# Patient Record
Sex: Female | Born: 1965
Health system: Southern US, Community
[De-identification: ages and names within clinical notes are randomized; demographics above are authoritative.]

## PROBLEM LIST (undated history)

## (undated) DIAGNOSIS — E119 Type 2 diabetes mellitus without complications: Secondary | ICD-10-CM

## (undated) DIAGNOSIS — T7840XA Allergy, unspecified, initial encounter: Secondary | ICD-10-CM

## (undated) HISTORY — DX: Type 2 diabetes mellitus without complications: E11.9

## (undated) HISTORY — PX: ABLATION: SHX5711

## (undated) HISTORY — PX: DENTAL SURGERY: SHX609

## (undated) HISTORY — PX: CHOLECYSTECTOMY: SHX55

## (undated) HISTORY — DX: Allergy, unspecified, initial encounter: T78.40XA

---

## 1999-10-26 ENCOUNTER — Emergency Department (HOSPITAL_COMMUNITY): Admission: EM | Admit: 1999-10-26 | Discharge: 1999-10-27 | Payer: Self-pay | Admitting: Emergency Medicine

## 2014-05-26 ENCOUNTER — Ambulatory Visit (INDEPENDENT_AMBULATORY_CARE_PROVIDER_SITE_OTHER): Payer: BLUE CROSS/BLUE SHIELD | Admitting: Medical

## 2014-05-26 ENCOUNTER — Encounter: Payer: Self-pay | Admitting: Medical

## 2014-05-26 VITALS — BP 113/75 | HR 80 | Temp 97.6°F | Ht 65.0 in | Wt 146.0 lb

## 2014-05-26 DIAGNOSIS — J301 Allergic rhinitis due to pollen: Secondary | ICD-10-CM

## 2014-05-26 DIAGNOSIS — H60399 Other infective otitis externa, unspecified ear: Secondary | ICD-10-CM | POA: Insufficient documentation

## 2014-05-26 DIAGNOSIS — H6502 Acute serous otitis media, left ear: Secondary | ICD-10-CM | POA: Diagnosis not present

## 2014-05-26 DIAGNOSIS — J309 Allergic rhinitis, unspecified: Secondary | ICD-10-CM | POA: Insufficient documentation

## 2014-05-26 DIAGNOSIS — H60392 Other infective otitis externa, left ear: Secondary | ICD-10-CM

## 2014-05-26 DIAGNOSIS — H6690 Otitis media, unspecified, unspecified ear: Secondary | ICD-10-CM | POA: Insufficient documentation

## 2014-05-26 MED ORDER — FLUCONAZOLE 150 MG PO TABS: 150.0000 mg | ORAL_TABLET | Freq: Once | ORAL | Status: DC

## 2014-05-26 MED ORDER — AZITHROMYCIN 250 MG PO TABS: ORAL_TABLET | ORAL | Status: DC

## 2014-05-26 MED ORDER — NEOMYCIN-POLYMYXIN-HC 1 % OT SOLN: 3.0000 [drp] | Freq: Four times a day (QID) | OTIC | Status: DC

## 2014-05-26 NOTE — Assessment & Plan Note (Signed)
Relatively controlled now. Spring and fall are worst seasons.

## 2014-05-26 NOTE — Assessment & Plan Note (Signed)
By appearance and will give cortisporin ear drops.

## 2014-05-26 NOTE — Progress Notes (Signed)
Subjective:    Patient ID: Suzanne Moore, female    DOB: February 12, 1966, 49 y.o.   MRN: [id number]  HPI   I have reviewed pt PMH, PSH, FH, Social History and Surgical History.  Allergies- Seasonal. Worst season spring and fall. Sometimes sinus infections. Presently controlled. [facility].  Gestational diabetes- only. That was last checked a while ago.   Hx irregular menstruation/heavy- Since ablation has been controlled.  Pt has acute complaint of some left ear that may have some bleeding. Sunday she felt  ear wet and she got a q tip. She got some blood  on q tip when she used. [facility] and they saw some blood in the canal but could not determine source. Pt has some pain last night from ear toward cheek.  Pt gets history of occasional ear infections. Also occasional swimmers ear.  Faint occasional allergies itchy eyes and scratchy throat but rains will help and prevent full flare.  Pt esthetician(skin), Exercise 5 days a week, 1 sweet tea a day, Married- 2 children.      Review of Systems  Constitutional: Negative for fever, chills and fatigue.  HENT: Positive for ear pain and sore throat. Negative for congestion, dental problem, postnasal drip, rhinorrhea, sneezing and tinnitus.        Mild scratchy throat.  Eyes: Positive for itching.  Respiratory: Negative for cough, chest tightness, shortness of breath and wheezing.   Cardiovascular: Negative for chest pain and palpitations.  Skin: Negative for rash.  Neurological: Negative for dizziness, syncope, weakness and light-headedness.  Hematological: Negative for adenopathy. Does not bruise/bleed easily.   Past Medical History  Diagnosis Date  . Allergy   . Diabetes mellitus without complication     Gestational Diabetes    History   Social History  . Marital Status: Married    Spouse Name: N/A  . Number of Children: N/A  . Years of Education: N/A   Occupational History  . Not on  file.   Social History Main Topics  . Smoking status: Former Games developermoker  . Smokeless tobacco: Never Used  . Alcohol Use: No  . Drug Use: Not on file  . Sexual Activity: Not on file   Other Topics Concern  . Not on file   Social History Narrative  . No narrative on file    Past Surgical History  Procedure Laterality Date  . Cholecystectomy    . Cesarean section    . Ablation    . Dental surgery      Teeth removed for Dentures    Family History  Problem Relation Age of Onset  . Hyperlipidemia Mother   . Diabetes Mother   . Heart disease Mother   . Bipolar disorder Mother   . Stroke Father   . Glaucoma Father     Allergies  Allergen Reactions  . Penicillins     Yeast infections(side effect)    No current outpatient prescriptions on file prior to visit.   No current facility-administered medications on file prior to visit.    BP 113/75 mmHg  Pulse 80  Temp(Src) 97.6 F (36.4 C) (Oral)  Ht 5\' 5"  (1.651 m)  Wt 146 lb (66.225 kg)  BMI 24.30 kg/m2  SpO2 98%      Objective:   Physical Exam  General  Mental Status - Alert. General Appearance - Well groomed. Not in acute distress.  Skin Rashes- No Rashes.  HEENT Head- Normal. Ear Auditory  Canal - Left-bottom of canal mild red and inflammed. Back side of canal wax vs scab  Right - Normal.Tympanic Membrane- Left- faint red on edge of tmRight- Normal. Eye Sclera/Conjunctiva- Left- Normal. Right- Normal. Nose & Sinuses Nasal Mucosa- Left-  Boggy and Congested. Right-  Boggy and  Congested.No  maxillary and no frontal sinus pressure. Mouth & Throat Lips: Upper Lip- Normal: no dryness, cracking, pallor, cyanosis, or vesicular eruption. Lower Lip-Normal: no dryness, cracking, pallor, cyanosis or vesicular eruption. Buccal Mucosa- Bilateral- No Aphthous ulcers. Oropharynx- No Discharge or Erythema. Tonsils: Characteristics- Bilateral- No Erythema or Congestion. Size/Enlargement- Bilateral- No enlargement.  Discharge- bilateral-None.  Neck Neck- Supple. No Masses.   Chest and Lung Exam Auscultation: Breath Sounds:-Clear even and unlabored.  Cardiovascular Auscultation:Rythm- Regular, rate and rhythm. Murmurs & Other Heart Sounds:Ausculatation of the heart reveal- No Murmurs.  Lymphatic Head & Neck General Head & Neck Lymphatics: Bilateral: Description- No Localized lymphadenopathy.       Assessment & Plan:

## 2014-05-26 NOTE — Patient Instructions (Signed)
Otitis, externa, infective By appearance and will give cortisporin ear drops.   Otitis media Edge of tm red and with recent moderate pain will give antibiotic.     Also would recommend flonase and claritin for potential allergies.   Follow up in 3-4 wks or as needed(prior to ent referral)  Also could schedule early morning wellness exam over next 2-3 months.

## 2014-05-26 NOTE — Assessment & Plan Note (Signed)
Edge of tm red and with recent moderate pain will give antibiotic.

## 2014-05-26 NOTE — Progress Notes (Signed)
Pre visit review using our clinic review tool, if applicable. No additional management support is needed unless otherwise documented below in the visit note. 

## 2014-06-08 ENCOUNTER — Telehealth: Payer: Self-pay | Admitting: *Deleted

## 2014-06-08 NOTE — Telephone Encounter (Signed)
Unable to reach patient at time of Pre-Visit Call.  Left message for patient to return call when available.    

## 2014-06-09 ENCOUNTER — Telehealth: Payer: Self-pay | Admitting: Medical

## 2014-06-09 ENCOUNTER — Ambulatory Visit (HOSPITAL_BASED_OUTPATIENT_CLINIC_OR_DEPARTMENT_OTHER)
Admission: RE | Admit: 2014-06-09 | Discharge: 2014-06-09 | Disposition: A | Discharge status: Home / Self Care | Payer: BLUE CROSS/BLUE SHIELD | Source: Ambulatory Visit | Attending: Medical | Admitting: Medical

## 2014-06-09 ENCOUNTER — Ambulatory Visit (INDEPENDENT_AMBULATORY_CARE_PROVIDER_SITE_OTHER): Payer: BLUE CROSS/BLUE SHIELD | Admitting: Medical

## 2014-06-09 ENCOUNTER — Other Ambulatory Visit (HOSPITAL_COMMUNITY)
Admission: RE | Admit: 2014-06-09 | Discharge: 2014-06-09 | Disposition: A | Discharge status: Home / Self Care | Payer: BLUE CROSS/BLUE SHIELD | Source: Ambulatory Visit | Attending: Family Medicine | Admitting: Family Medicine

## 2014-06-09 ENCOUNTER — Encounter: Payer: Self-pay | Admitting: Medical

## 2014-06-09 VITALS — BP 111/76 | HR 74 | Temp 98.0°F | Ht 65.0 in | Wt 142.4 lb

## 2014-06-09 DIAGNOSIS — Z1239 Encounter for other screening for malignant neoplasm of breast: Secondary | ICD-10-CM

## 2014-06-09 DIAGNOSIS — Z23 Encounter for immunization: Secondary | ICD-10-CM | POA: Diagnosis not present

## 2014-06-09 DIAGNOSIS — Z1389 Encounter for screening for other disorder: Secondary | ICD-10-CM

## 2014-06-09 DIAGNOSIS — Z124 Encounter for screening for malignant neoplasm of cervix: Secondary | ICD-10-CM

## 2014-06-09 DIAGNOSIS — R82998 Other abnormal findings in urine: Secondary | ICD-10-CM

## 2014-06-09 DIAGNOSIS — Z01419 Encounter for gynecological examination (general) (routine) without abnormal findings: Secondary | ICD-10-CM | POA: Diagnosis present

## 2014-06-09 DIAGNOSIS — Z0000 Encounter for general adult medical examination without abnormal findings: Secondary | ICD-10-CM | POA: Diagnosis not present

## 2014-06-09 DIAGNOSIS — N390 Urinary tract infection, site not specified: Secondary | ICD-10-CM | POA: Diagnosis not present

## 2014-06-09 DIAGNOSIS — Z87898 Personal history of other specified conditions: Secondary | ICD-10-CM

## 2014-06-09 DIAGNOSIS — N63 Unspecified lump in unspecified breast: Secondary | ICD-10-CM

## 2014-06-09 LAB — COMPREHENSIVE METABOLIC PANEL
ALT: 20 U/L (ref 0–35)
AST: 19 U/L (ref 0–37)
Albumin: 4.3 g/dL (ref 3.5–5.2)
Alkaline Phosphatase: 75 U/L (ref 39–117)
BILIRUBIN TOTAL: 0.4 mg/dL (ref 0.2–1.2)
BUN: 17 mg/dL (ref 6–23)
CALCIUM: 9.2 mg/dL (ref 8.4–10.5)
CO2: 31 mEq/L (ref 19–32)
Chloride: 106 mEq/L (ref 96–112)
Creatinine, Ser: 0.91 mg/dL (ref 0.40–1.20)
GFR: 69.86 mL/min (ref >60.00)
Glucose, Bld: 94 mg/dL (ref 70–99)
POTASSIUM: 3.7 meq/L (ref 3.5–5.1)
Sodium: 142 mEq/L (ref 135–145)
TOTAL PROTEIN: 6.8 g/dL (ref 6.0–8.3)

## 2014-06-09 LAB — LIPID PANEL
CHOL/HDL RATIO: 3
Cholesterol: 129 mg/dL (ref 0–200)
HDL: 39.5 mg/dL (ref >39.00)
LDL Cholesterol: 77 mg/dL (ref 0–99)
NonHDL: 89.5
TRIGLYCERIDES: 64 mg/dL (ref 0.0–149.0)
VLDL: 12.8 mg/dL (ref 0.0–40.0)

## 2014-06-09 LAB — CBC WITH DIFFERENTIAL/PLATELET
Basophils Absolute: 0 10*3/uL (ref 0.0–0.1)
Basophils Relative: 0.5 % (ref 0.0–3.0)
EOS PCT: 4.7 % (ref 0.0–5.0)
Eosinophils Absolute: 0.2 10*3/uL (ref 0.0–0.7)
HEMATOCRIT: 42.4 % (ref 36.0–46.0)
Hemoglobin: 14.6 g/dL (ref 12.0–15.0)
LYMPHS ABS: 1.5 10*3/uL (ref 0.7–4.0)
Lymphocytes Relative: 29.5 % (ref 12.0–46.0)
MCHC: 34.5 g/dL (ref 30.0–36.0)
MCV: 87.8 fl (ref 78.0–100.0)
MONO ABS: 0.4 10*3/uL (ref 0.1–1.0)
MONOS PCT: 8 % (ref 3.0–12.0)
Neutro Abs: 2.9 10*3/uL (ref 1.4–7.7)
Neutrophils Relative %: 57.3 % (ref 43.0–77.0)
PLATELETS: 146 10*3/uL — AB (ref 150.0–400.0)
RBC: 4.83 Mil/uL (ref 3.87–5.11)
RDW: 11.7 % (ref 11.5–15.5)
WBC: 5 10*3/uL (ref 4.0–10.5)

## 2014-06-09 LAB — POCT URINALYSIS DIPSTICK
Bilirubin, UA: 1
Glucose, UA: NEGATIVE
Ketones, UA: NEGATIVE
Nitrite, UA: NEGATIVE
PH UA: 6
Protein, UA: 15
Spec Grav, UA: >=1.03
UROBILINOGEN UA: 0.2

## 2014-06-09 LAB — TSH: TSH: 1.84 u[IU]/mL (ref 0.35–4.50)

## 2014-06-09 NOTE — Telephone Encounter (Signed)
You will need to place the order

## 2014-06-09 NOTE — Telephone Encounter (Signed)
Per radiology, pt [facility]. I do not see any reports

## 2014-06-09 NOTE — Patient Instructions (Addendum)
Wellness examination Cbc,cmp, tsh, lipid panel, ua, tdap today.  Pap done and order for mammogram placed.      Preventive Care for Adults A healthy lifestyle and preventive care can promote health and wellness. Preventive health guidelines for women include the following key practices.  A routine yearly physical is a good way to check with your health care provider about your health and preventive screening. It is a chance to share any concerns and updates on your health and to receive a thorough exam.  Visit your dentist for a routine exam and preventive care every 6 months. Brush your teeth twice a day and floss once a day. Good oral hygiene prevents tooth decay and gum disease.  The frequency of eye exams is based on your age, health, family medical history, use of contact lenses, and other factors. Follow your health care provider's recommendations for frequency of eye exams.  Eat a healthy diet. Foods like vegetables, fruits, whole grains, low-fat dairy products, and lean protein foods contain the nutrients you need without too many calories. Decrease your intake of foods high in solid fats, added sugars, and salt. Eat the right amount of calories for you.Get information about a proper diet from your health care provider, if necessary.  Regular physical exercise is one of the most important things you can do for your health. Most adults should get at least 150 minutes of moderate-intensity exercise (any activity that increases your heart rate and causes you to sweat) each week. In addition, most adults need muscle-strengthening exercises on 2 or more days a week.  Maintain a healthy weight. The body mass index (BMI) is a screening tool to identify possible weight problems. It provides an estimate of body fat based on height and weight. Your health care provider can find your BMI and can help you achieve or maintain a healthy weight.For adults 20 years and older:  A BMI below 18.5 is  considered underweight.  A BMI of 18.5 to 24.9 is normal.  A BMI of 25 to 29.9 is considered overweight.  A BMI of 30 and above is considered obese.  Maintain normal blood lipids and cholesterol levels by exercising and minimizing your intake of saturated fat. Eat a balanced diet with plenty of fruit and vegetables. Blood tests for lipids and cholesterol should begin at age 70 and be repeated every 5 years. If your lipid or cholesterol levels are high, you are over 50, or you are at high risk for heart disease, you may need your cholesterol levels checked more frequently.Ongoing high lipid and cholesterol levels should be treated with medicines if diet and exercise are not working.  If you smoke, find out from your health care provider how to quit. If you do not use tobacco, do not start.  Lung cancer screening is recommended for adults aged 43-80 years who are at high risk for developing lung cancer because of a history of smoking. A yearly low-dose CT scan of the lungs is recommended for people who have at least a 30-pack-year history of smoking and are a current smoker or have quit within the past 15 years. A pack year of smoking is smoking an average of 1 pack of cigarettes a day for 1 year (for example: 1 pack a day for 30 years or 2 packs a day for 15 years). Yearly screening should continue until the smoker has stopped smoking for at least 15 years. Yearly screening should be stopped for people who develop a health problem  that would prevent them from having lung cancer treatment.  If you are pregnant, do not drink alcohol. If you are breastfeeding, be very cautious about drinking alcohol. If you are not pregnant and choose to drink alcohol, do not have more than 1 drink per day. One drink is considered to be 12 ounces (355 mL) of beer, 5 ounces (148 mL) of wine, or 1.5 ounces (44 mL) of liquor.  Avoid use of street drugs. Do not share needles with anyone. Ask for help if you need support or  instructions about stopping the use of drugs.  High blood pressure causes heart disease and increases the risk of stroke. Your blood pressure should be checked at least every 1 to 2 years. Ongoing high blood pressure should be treated with medicines if weight loss and exercise do not work.  If you are 55-79 years old, ask your health care provider if you should take aspirin to prevent strokes.  Diabetes screening involves taking a blood sample to check your fasting blood sugar level. This should be done once every 3 years, after age 45, if you are within normal weight and without risk factors for diabetes. Testing should be considered at a younger age or be carried out more frequently if you are overweight and have at least 1 risk factor for diabetes.  Breast cancer screening is essential preventive care for women. You should practice "breast self-awareness." This means understanding the normal appearance and feel of your breasts and may include breast self-examination. Any changes detected, no matter how small, should be reported to a health care provider. Women in their 20s and 30s should have a clinical breast exam (CBE) by a health care provider as part of a regular health exam every 1 to 3 years. After age 40, women should have a CBE every year. Starting at age 40, women should consider having a mammogram (breast X-ray test) every year. Women who have a family history of breast cancer should talk to their health care provider about genetic screening. Women at a high risk of breast cancer should talk to their health care providers about having an MRI and a mammogram every year.  Breast cancer gene (BRCA)-related cancer risk assessment is recommended for women who have family members with BRCA-related cancers. BRCA-related cancers include breast, ovarian, tubal, and peritoneal cancers. Having family members with these cancers may be associated with an increased risk for harmful changes (mutations) in  the breast cancer genes BRCA1 and BRCA2. Results of the assessment will determine the need for genetic counseling and BRCA1 and BRCA2 testing.  Routine pelvic exams to screen for cancer are no longer recommended for nonpregnant women who are considered low risk for cancer of the pelvic organs (ovaries, uterus, and vagina) and who do not have symptoms. Ask your health care provider if a screening pelvic exam is right for you.  If you have had past treatment for cervical cancer or a condition that could lead to cancer, you need Pap tests and screening for cancer for at least 20 years after your treatment. If Pap tests have been discontinued, your risk factors (such as having a new sexual partner) need to be reassessed to determine if screening should be resumed. Some women have medical problems that increase the chance of getting cervical cancer. In these cases, your health care provider may recommend more frequent screening and Pap tests.  The HPV test is an additional test that may be used for cervical cancer screening. The HPV test   looks for the virus that can cause the cell changes on the cervix. The cells collected during the Pap test can be tested for HPV. The HPV test could be used to screen women aged 23 years and older, and should be used in women of any age who have unclear Pap test results. After the age of 28, women should have HPV testing at the same frequency as a Pap test.  Colorectal cancer can be detected and often prevented. Most routine colorectal cancer screening begins at the age of 93 years and continues through age 46 years. However, your health care provider may recommend screening at an earlier age if you have risk factors for colon cancer. On a yearly basis, your health care provider may provide home test kits to check for hidden blood in the stool. Use of a small camera at the end of a tube, to directly examine the colon (sigmoidoscopy or colonoscopy), can detect the earliest forms  of colorectal cancer. Talk to your health care provider about this at age 29, when routine screening begins. Direct exam of the colon should be repeated every 5-10 years through age 17 years, unless early forms of pre-cancerous polyps or small growths are found.  People who are at an increased risk for hepatitis B should be screened for this virus. You are considered at high risk for hepatitis B if:  You were born in a country where hepatitis B occurs often. Talk with your health care provider about which countries are considered high risk.  Your parents were born in a high-risk country and you have not received a shot to protect against hepatitis B (hepatitis B vaccine).  You have HIV or AIDS.  You use needles to inject street drugs.  You live with, or have sex with, someone who has hepatitis B.  You get hemodialysis treatment.  You take certain medicines for conditions like cancer, organ transplantation, and autoimmune conditions.  Hepatitis C blood testing is recommended for all people born from [birth year] through 1965 and any individual with known risks for hepatitis C.  Practice safe sex. Use condoms and avoid high-risk sexual practices to reduce the spread of sexually transmitted infections (STIs). STIs include gonorrhea, chlamydia, syphilis, trichomonas, herpes, HPV, and human immunodeficiency virus (HIV). Herpes, HIV, and HPV are viral illnesses that have no cure. They can result in disability, cancer, and death.  You should be screened for sexually transmitted illnesses (STIs) including gonorrhea and chlamydia if:  You are sexually active and are younger than 24 years.  You are older than 24 years and your health care provider tells you that you are at risk for this type of infection.  Your sexual activity has changed since you were last screened and you are at an increased risk for chlamydia or gonorrhea. Ask your health care provider if you are at risk.  If you are at risk of  being infected with HIV, it is recommended that you take a prescription medicine daily to prevent HIV infection. This is called preexposure prophylaxis (PrEP). You are considered at risk if:  You are a heterosexual woman, are sexually active, and are at increased risk for HIV infection.  You take drugs by injection.  You are sexually active with a partner who has HIV.  Talk with your health care provider about whether you are at high risk of being infected with HIV. If you choose to begin PrEP, you should first be tested for HIV. You should then be tested every 3  months for as long as you are taking PrEP.  Osteoporosis is a disease in which the bones lose minerals and strength with aging. This can result in serious bone fractures or breaks. The risk of osteoporosis can be identified using a bone density scan. Women ages 78 years and over and women at risk for fractures or osteoporosis should discuss screening with their health care providers. Ask your health care provider whether you should take a calcium supplement or vitamin D to reduce the rate of osteoporosis.  Menopause can be associated with physical symptoms and risks. Hormone replacement therapy is available to decrease symptoms and risks. You should talk to your health care provider about whether hormone replacement therapy is right for you.  Use sunscreen. Apply sunscreen liberally and repeatedly throughout the day. You should seek shade when your shadow is shorter than you. Protect yourself by wearing long sleeves, pants, a wide-brimmed hat, and sunglasses year round, whenever you are outdoors.  Once a month, do a whole body skin exam, using a mirror to look at the skin on your back. Tell your health care provider of new moles, moles that have irregular borders, moles that are larger than a pencil eraser, or moles that have changed in shape or color.  Stay current with required vaccines (immunizations).  Influenza vaccine. All adults  should be immunized every year.  Tetanus, diphtheria, and acellular pertussis (Td, Tdap) vaccine. Pregnant women should receive 1 dose of Tdap vaccine during each pregnancy. The dose should be obtained regardless of the length of time since the last dose. Immunization is preferred during the 27th-36th week of gestation. An adult who has not previously received Tdap or who does not know her vaccine status should receive 1 dose of Tdap. This initial dose should be followed by tetanus and diphtheria toxoids (Td) booster doses every 10 years. Adults with an unknown or incomplete history of completing a 3-dose immunization series with Td-containing vaccines should begin or complete a primary immunization series including a Tdap dose. Adults should receive a Td booster every 10 years.  Varicella vaccine. An adult without evidence of immunity to varicella should receive 2 doses or a second dose if she has previously received 1 dose. Pregnant females who do not have evidence of immunity should receive the first dose after pregnancy. This first dose should be obtained before leaving the health care facility. The second dose should be obtained 4-8 weeks after the first dose.  Human papillomavirus (HPV) vaccine. Females aged 13-26 years who have not received the vaccine previously should obtain the 3-dose series. The vaccine is not recommended for use in pregnant females. However, pregnancy testing is not needed before receiving a dose. If a female is found to be pregnant after receiving a dose, no treatment is needed. In that case, the remaining doses should be delayed until after the pregnancy. Immunization is recommended for any person with an immunocompromised condition through the age of 65 years if she did not get any or all doses earlier. During the 3-dose series, the second dose should be obtained 4-8 weeks after the first dose. The third dose should be obtained 24 weeks after the first dose and 16 weeks after  the second dose.  Zoster vaccine. One dose is recommended for adults aged 11 years or older unless certain conditions are present.  Measles, mumps, and rubella (MMR) vaccine. Adults born before [birth year] generally are considered immune to measles and mumps. Adults born in [birth year] or later should have 1  or more doses of MMR vaccine unless there is a contraindication to the vaccine or there is laboratory evidence of immunity to each of the three diseases. A routine second dose of MMR vaccine should be obtained at least 28 days after the first dose for students attending postsecondary schools, health care workers, or international travelers. People who received inactivated measles vaccine or an unknown type of measles vaccine during 1963-1967 should receive 2 doses of MMR vaccine. People who received inactivated mumps vaccine or an unknown type of mumps vaccine before 1979 and are at high risk for mumps infection should consider immunization with 2 doses of MMR vaccine. For females of childbearing age, rubella immunity should be determined. If there is no evidence of immunity, females who are not pregnant should be vaccinated. If there is no evidence of immunity, females who are pregnant should delay immunization until after pregnancy. Unvaccinated health care workers born before 1957 who lack laboratory evidence of measles, mumps, or rubella immunity or laboratory confirmation of disease should consider measles and mumps immunization with 2 doses of MMR vaccine or rubella immunization with 1 dose of MMR vaccine.  Pneumococcal 13-valent conjugate (PCV13) vaccine. When indicated, a person who is uncertain of her immunization history and has no record of immunization should receive the PCV13 vaccine. An adult aged 19 years or older who has certain medical conditions and has not been previously immunized should receive 1 dose of PCV13 vaccine. This PCV13 should be followed with a dose of pneumococcal polysaccharide (PPSV23)  vaccine. The PPSV23 vaccine dose should be obtained at least 8 weeks after the dose of PCV13 vaccine. An adult aged 19 years or older who has certain medical conditions and previously received 1 or more doses of PPSV23 vaccine should receive 1 dose of PCV13. The PCV13 vaccine dose should be obtained 1 or more years after the last PPSV23 vaccine dose.  Pneumococcal polysaccharide (PPSV23) vaccine. When PCV13 is also indicated, PCV13 should be obtained first. All adults aged 65 years and older should be immunized. An adult younger than age 65 years who has certain medical conditions should be immunized. Any person who resides in a nursing home or long-term care facility should be immunized. An adult smoker should be immunized. People with an immunocompromised condition and certain other conditions should receive both PCV13 and PPSV23 vaccines. People with human immunodeficiency virus (HIV) infection should be immunized as soon as possible after diagnosis. Immunization during chemotherapy or radiation therapy should be avoided. Routine use of PPSV23 vaccine is not recommended for American Indians, [place] Natives, or people younger than 65 years unless there are medical conditions that require PPSV23 vaccine. When indicated, people who have unknown immunization and have no record of immunization should receive PPSV23 vaccine. One-time revaccination 5 years after the first dose of PPSV23 is recommended for people aged 19-64 years who have chronic kidney failure, nephrotic syndrome, asplenia, or immunocompromised conditions. People who received 1-2 doses of PPSV23 before age 65 years should receive another dose of PPSV23 vaccine at age 65 years or later if at least 5 years have passed since the previous dose. Doses of PPSV23 are not needed for people immunized with PPSV23 at or after age 65 years.  Meningococcal vaccine. Adults with asplenia or persistent complement component deficiencies should receive 2 doses of  quadrivalent meningococcal conjugate (MenACWY-D) vaccine. The doses should be obtained at least 2 months apart. Microbiologists working with certain meningococcal bacteria, military recruits, people at risk during an outbreak, and people who   travel to or live in countries with a high rate of meningitis should be immunized. A first-year college student up through age 63 years who is living in a residence hall should receive a dose if she did not receive a dose on or after her 16th birthday. Adults who have certain high-risk conditions should receive one or more doses of vaccine.  Hepatitis A vaccine. Adults who wish to be protected from this disease, have certain high-risk conditions, work with hepatitis A-infected animals, work in hepatitis A research labs, or travel to or work in countries with a high rate of hepatitis A should be immunized. Adults who were previously unvaccinated and who anticipate close contact with an international adoptee during the first 60 days after arrival in the [place] from a country with a high rate of hepatitis A should be immunized.  Hepatitis B vaccine. Adults who wish to be protected from this disease, have certain high-risk conditions, may be exposed to blood or other infectious body fluids, are household contacts or sex partners of hepatitis B positive people, are clients or workers in certain care facilities, or travel to or work in countries with a high rate of hepatitis B should be immunized.  Haemophilus influenzae type b (Hib) vaccine. A previously unvaccinated person with asplenia or sickle cell disease or having a scheduled splenectomy should receive 1 dose of Hib vaccine. Regardless of previous immunization, a recipient of a hematopoietic stem cell transplant should receive a 3-dose series 6-12 months after her successful transplant. Hib vaccine is not recommended for adults with HIV infection. Preventive Services / Frequency Ages 31 to 10 years  Blood  pressure check.** / Every 1 to 2 years.  Lipid and cholesterol check.** / Every 5 years beginning at age 47.  Clinical breast exam.** / Every 3 years for women in their 21s and 88s.  BRCA-related cancer risk assessment.** / For women who have family members with a BRCA-related cancer (breast, ovarian, tubal, or peritoneal cancers).  Pap test.** / Every 2 years from ages 76 through 11. Every 3 years starting at age 45 through age 52 or 66 with a history of 3 consecutive normal Pap tests.  HPV screening.** / Every 3 years from ages 31 through ages 23 to 85 with a history of 3 consecutive normal Pap tests.  Hepatitis C blood test.** / For any individual with known risks for hepatitis C.  Skin self-exam. / Monthly.  Influenza vaccine. / Every year.  Tetanus, diphtheria, and acellular pertussis (Tdap, Td) vaccine.** / Consult your health care provider. Pregnant women should receive 1 dose of Tdap vaccine during each pregnancy. 1 dose of Td every 10 years.  Varicella vaccine.** / Consult your health care provider. Pregnant females who do not have evidence of immunity should receive the first dose after pregnancy.  HPV vaccine. / 3 doses over 6 months, if 6 and younger. The vaccine is not recommended for use in pregnant females. However, pregnancy testing is not needed before receiving a dose.  Measles, mumps, rubella (MMR) vaccine.** / You need at least 1 dose of MMR if you were born in 1957 or later. You may also need a 2nd dose. For females of childbearing age, rubella immunity should be determined. If there is no evidence of immunity, females who are not pregnant should be vaccinated. If there is no evidence of immunity, females who are pregnant should delay immunization until after pregnancy.  Pneumococcal 13-valent conjugate (PCV13) vaccine.** / Consult your health care provider.  Pneumococcal polysaccharide (PPSV23) vaccine.** / 1 to 2 doses if you smoke cigarettes or if you have certain  conditions.  Meningococcal vaccine.** / 1 dose if you are age 19 to 21 years and a first-year college student living in a residence hall, or have one of several medical conditions, you need to get vaccinated against meningococcal disease. You may also need additional booster doses.  Hepatitis A vaccine.** / Consult your health care provider.  Hepatitis B vaccine.** / Consult your health care provider.  Haemophilus influenzae type b (Hib) vaccine.** / Consult your health care provider. Ages 40 to 64 years  Blood pressure check.** / Every 1 to 2 years.  Lipid and cholesterol check.** / Every 5 years beginning at age 20 years.  Lung cancer screening. / Every year if you are aged 55-80 years and have a 30-pack-year history of smoking and currently smoke or have quit within the past 15 years. Yearly screening is stopped once you have quit smoking for at least 15 years or develop a health problem that would prevent you from having lung cancer treatment.  Clinical breast exam.** / Every year after age 40 years.  BRCA-related cancer risk assessment.** / For women who have family members with a BRCA-related cancer (breast, ovarian, tubal, or peritoneal cancers).  Mammogram.** / Every year beginning at age 40 years and continuing for as long as you are in good health. Consult with your health care provider.  Pap test.** / Every 3 years starting at age 30 years through age 65 or 70 years with a history of 3 consecutive normal Pap tests.  HPV screening.** / Every 3 years from ages 30 years through ages 65 to 70 years with a history of 3 consecutive normal Pap tests.  Fecal occult blood test (FOBT) of stool. / Every year beginning at age 50 years and continuing until age 75 years. You may not need to do this test if you get a colonoscopy every 10 years.  Flexible sigmoidoscopy or colonoscopy.** / Every 5 years for a flexible sigmoidoscopy or every 10 years for a colonoscopy beginning at age 50 years  and continuing until age 75 years.  Hepatitis C blood test.** / For all people born from 1945 through 1965 and any individual with known risks for hepatitis C.  Skin self-exam. / Monthly.  Influenza vaccine. / Every year.  Tetanus, diphtheria, and acellular pertussis (Tdap/Td) vaccine.** / Consult your health care provider. Pregnant women should receive 1 dose of Tdap vaccine during each pregnancy. 1 dose of Td every 10 years.  Varicella vaccine.** / Consult your health care provider. Pregnant females who do not have evidence of immunity should receive the first dose after pregnancy.  Zoster vaccine.** / 1 dose for adults aged 60 years or older.  Measles, mumps, rubella (MMR) vaccine.** / You need at least 1 dose of MMR if you were born in 1957 or later. You may also need a 2nd dose. For females of childbearing age, rubella immunity should be determined. If there is no evidence of immunity, females who are not pregnant should be vaccinated. If there is no evidence of immunity, females who are pregnant should delay immunization until after pregnancy.  Pneumococcal 13-valent conjugate (PCV13) vaccine.** / Consult your health care provider.  Pneumococcal polysaccharide (PPSV23) vaccine.** / 1 to 2 doses if you smoke cigarettes or if you have certain conditions.  Meningococcal vaccine.** / Consult your health care provider.  Hepatitis A vaccine.** / Consult your health care provider.  Hepatitis   B vaccine.** / Consult your health care provider.  Haemophilus influenzae type b (Hib) vaccine.** / Consult your health care provider. Ages 26 years and over  Blood pressure check.** / Every 1 to 2 years.  Lipid and cholesterol check.** / Every 5 years beginning at age 54 years.  Lung cancer screening. / Every year if you are aged 46-80 years and have a 30-pack-year history of smoking and currently smoke or have quit within the past 15 years. Yearly screening is stopped once you have quit smoking  for at least 15 years or develop a health problem that would prevent you from having lung cancer treatment.  Clinical breast exam.** / Every year after age 10 years.  BRCA-related cancer risk assessment.** / For women who have family members with a BRCA-related cancer (breast, ovarian, tubal, or peritoneal cancers).  Mammogram.** / Every year beginning at age 59 years and continuing for as long as you are in good health. Consult with your health care provider.  Pap test.** / Every 3 years starting at age 71 years through age 46 or 81 years with 3 consecutive normal Pap tests. Testing can be stopped between 65 and 70 years with 3 consecutive normal Pap tests and no abnormal Pap or HPV tests in the past 10 years.  HPV screening.** / Every 3 years from ages 57 years through ages 54 or 24 years with a history of 3 consecutive normal Pap tests. Testing can be stopped between 65 and 70 years with 3 consecutive normal Pap tests and no abnormal Pap or HPV tests in the past 10 years.  Fecal occult blood test (FOBT) of stool. / Every year beginning at age 3 years and continuing until age 74 years. You may not need to do this test if you get a colonoscopy every 10 years.  Flexible sigmoidoscopy or colonoscopy.** / Every 5 years for a flexible sigmoidoscopy or every 10 years for a colonoscopy beginning at age 5 years and continuing until age 76 years.  Hepatitis C blood test.** / For all people born from [birth year] through 1965 and any individual with known risks for hepatitis C.  Osteoporosis screening.** / A one-time screening for women ages 23 years and over and women at risk for fractures or osteoporosis.  Skin self-exam. / Monthly.  Influenza vaccine. / Every year.  Tetanus, diphtheria, and acellular pertussis (Tdap/Td) vaccine.** / 1 dose of Td every 10 years.  Varicella vaccine.** / Consult your health care provider.  Zoster vaccine.** / 1 dose for adults aged 25 years or older.  Pneumococcal  13-valent conjugate (PCV13) vaccine.** / Consult your health care provider.  Pneumococcal polysaccharide (PPSV23) vaccine.** / 1 dose for all adults aged 63 years and older.  Meningococcal vaccine.** / Consult your health care provider.  Hepatitis A vaccine.** / Consult your health care provider.  Hepatitis B vaccine.** / Consult your health care provider.  Haemophilus influenzae type b (Hib) vaccine.** / Consult your health care provider. ** Family history and personal history of risk and conditions may change your health care provider's recommendations. Document Released: 03/28/2001 Document Revised: 06/16/2013 Document Reviewed: 06/27/2010 [facility] Patient Information 2015 [place], [place]. This information is not intended to replace advice given to you by your health care provider. Make sure you discuss any questions you have with your health care provider.   Discussed with pt her moles. She does want screening/surveillance exam with derm.

## 2014-06-09 NOTE — Assessment & Plan Note (Signed)
Cbc,cmp, tsh, lipid panel, ua, tdap today.  Pap done and order for mammogram placed.

## 2014-06-09 NOTE — Progress Notes (Signed)
Pre visit review using our clinic review tool, if applicable. No additional management support is needed unless otherwise documented below in the visit note. 

## 2014-06-09 NOTE — Telephone Encounter (Signed)
Abnormal mammogram hx per radiologist.

## 2014-06-09 NOTE — Telephone Encounter (Signed)
Per Radiology, Pt needs follow up breast ultrasound and diagnostic mammogram, will need to be done at the breast center.

## 2014-06-09 NOTE — Progress Notes (Signed)
Subjective:    Patient ID: Suzanne Moore, female    DOB: [birth year], 49 y.o.   MRN: [id number]  HPI Pt esthetician(skin), Exercise 5 days a week, 1 sweet tea a day, Married- 2 children.  Last mammogram- Pt states only had on in her 30's. None since.  Last pap- Pt has not had pap smear in a while. Maybe at least 10 years. Normal pap that she remembers.  Appears due for tdap.- Pt agrees that has not had one done in while.  Has been years since any   No acute problems today.   Review of Systems  Constitutional: Negative for fever, chills, diaphoresis, activity change and fatigue.  HENT: Negative.   Respiratory: Negative for cough, chest tightness and shortness of breath.   Cardiovascular: Negative for chest pain, palpitations and leg swelling.  Gastrointestinal: Negative for nausea, vomiting and abdominal pain.  Endocrine: Negative for polydipsia, polyphagia and polyuria.  Genitourinary: Negative for dysuria, frequency, decreased urine volume, difficulty urinating, vaginal pain, pelvic pain and dyspareunia.  Musculoskeletal: Negative for neck pain and neck stiffness.  Neurological: Negative for dizziness, tremors, seizures, syncope, facial asymmetry, speech difficulty, weakness, light-headedness, numbness and headaches.  Psychiatric/Behavioral: Negative for behavioral problems, confusion and agitation. The patient is not nervous/anxious.      Past Medical History  Diagnosis Date  . Allergy   . Diabetes mellitus without complication     Gestational Diabetes    History   Social History  . Marital Status: Married    Spouse Name: N/A  . Number of Children: N/A  . Years of Education: N/A   Occupational History  . Not on file.   Social History Main Topics  . Smoking status: Former Games developer  . Smokeless tobacco: Never Used  . Alcohol Use: No  . Drug Use: No  . Sexual Activity: Not on file   Other Topics Concern  . Not on file   Social History Narrative    Past  Surgical History  Procedure Laterality Date  . Cholecystectomy    . Cesarean section    . Ablation    . Dental surgery      Teeth removed for Dentures    Family History  Problem Relation Age of Onset  . Hyperlipidemia Mother   . Diabetes Mother   . Heart disease Mother   . Bipolar disorder Mother   . Stroke Father   . Glaucoma Father     Allergies  Allergen Reactions  . Penicillins     Yeast infections(side effect)    Current Outpatient Prescriptions on File Prior to Visit  Medication Sig Dispense Refill  . NEOMYCIN-POLYMYXIN-HYDROCORTISONE (CORTISPORIN) 1 % SOLN otic solution Place 3 drops into the left ear every 6 (six) hours. 10 mL 0   No current facility-administered medications on file prior to visit.    BP 111/76 mmHg  Pulse 74  Temp(Src) 98 F (36.7 C) (Oral)  Ht  (1.651 m)  Wt 142 lb 6.4 oz (64.592 kg)  BMI 23.70 kg/m2  SpO2 99%      Objective:   Physical Exam  General   Mental Status- Alert.  Orientation-Oriented x3. Build and Nutrition Well Nourished and Well Developed.  Skin General: Normal.  Color- Normal color. Moisture- Dry.Temperature warm. Lesions: No suspicious lesions  Head, Eyes, Ears, Nose, Thoat Ears-Normal. Auditory Canal-Bilateral-Lt canal- small growth vs scab inside portion of canal. Rt side canal clear. Both TM are normal. Eyes Fundi- Bilateral-Normal. Pupil- Bilateral- Direct reaction to light normal. Nose &  Sinuses- Normal. Nostril- Bilateral-Normal.  Neck Neck- No Bruits or Masses. Thyroid- Normal. No thyromegaly or nodules.  Breast Breast Lump: No palpable masses, symmetric, no axillary lymphadenopathy palpated.  Chest and Lung Exam  Percussion: Quality and Intensity:-Percussion normal. Percussion of chest reveals- No Dullness. Palpation of the chest reveals- Non-tender. Auscultation: Breath sounds-Normal. Adventitious  Sounds:No adventitious   Vaginal External: Labia majora and minora normal/no lesions.  Pelvic/Bimanual exam: Cervical OS not red or friable. No discharge. No cervical motion tenderness. No masses felt on palpation of adnexal regions. Cardiovascular Inspection: No Heaves. Auscultation: Heart Sounds- Normal sinus rhythm without murmur or gallop, S1 WNL and S2 WNL.  Abdomen Inspection:- Inspection Normal. Inspection of abdomen reveals- No Hernias. Palpation/Percussion: Palpation and Percussion of the abdomen reveal- Non Tender and No Palpable masses. Liver: Other Characteristics- No Hepatmegaly Spleen:Other Characteristics- No Splenomegaly. Auscultation: Auscultation of the abdomen reveals-Bowel sounds normal and No Abdominal bruits.    Neurologic Mental Status- Normal Cranial Nerves- Normal Bilaterally, Motor- Normal. Strength:5/5 normal muscle strength- All Muscles. Meningeal Signs- None.  Musculoskeletal Global Assessment General- Joints show full range of motion without obvious deformity and Normal muscle mass. Strength 5/5 in upper and lower extremities.  Lymphatic General lymphatics Description-No Generalized lymphadenopathy.  Skin- some scattered moles on anterior thorax- non had any obvious worrisome feature. Some scattered moles on her back as well. Some are at borderline in terms of size.         Assessment & Plan:  (note if pap come back not sufficient then would refer to Gyn)

## 2014-06-09 NOTE — Telephone Encounter (Signed)
Who will put that order in. Sometimes they send me an order and I sign??

## 2014-06-09 NOTE — Telephone Encounter (Signed)
[facility]

## 2014-06-11 ENCOUNTER — Encounter: Payer: Self-pay | Admitting: Medical

## 2014-06-11 LAB — URINE CULTURE
Colony Count: NO GROWTH
Organism ID, Bacteria: NO GROWTH

## 2014-06-11 LAB — CYTOLOGY - PAP

## 2015-05-07 ENCOUNTER — Encounter: Payer: Self-pay | Admitting: Medical

## 2015-05-07 ENCOUNTER — Ambulatory Visit (INDEPENDENT_AMBULATORY_CARE_PROVIDER_SITE_OTHER): Payer: BLUE CROSS/BLUE SHIELD | Admitting: Medical

## 2015-05-07 VITALS — BP 110/68 | HR 67 | Temp 97.8°F | Ht 65.0 in | Wt 164.4 lb

## 2015-05-07 DIAGNOSIS — Z1211 Encounter for screening for malignant neoplasm of colon: Secondary | ICD-10-CM

## 2015-05-07 DIAGNOSIS — K649 Unspecified hemorrhoids: Secondary | ICD-10-CM

## 2015-05-07 MED ORDER — HYDROCORTISONE ACETATE 25 MG RE SUPP: 25.0000 mg | Freq: Two times a day (BID) | RECTAL | Status: DC

## 2015-05-07 MED ORDER — TRAMADOL HCL 50 MG PO TABS: 50.0000 mg | ORAL_TABLET | Freq: Three times a day (TID) | ORAL | Status: DC | PRN

## 2015-05-07 NOTE — Progress Notes (Signed)
Subjective:    Patient ID: Suzanne Moore, female    DOB: 06/02/1965, 50 y.o.   MRN: [id number]  HPI  Pt in with some hemorrhoid flare. She can feel the hemorrhoid. Itching and pain. Hurts on bowl movments. Some blood red blood when she wipes. Pt has years of on and off of flares. Pt has tried preparation H but has not helped.  Pt states she had years of intermittent flares but this is first evaluation for. Pt was some constipated before the flare.  Review of Systems  Constitutional: Negative for fever, chills and fatigue.  Respiratory: Negative for cough, chest tightness, shortness of breath and wheezing.   Cardiovascular: Negative for chest pain and palpitations.  Gastrointestinal: Negative for nausea, abdominal pain, diarrhea, blood in stool, anal bleeding and rectal pain.       Has been passing stools but mild difficult and straining.  Hemorrhoid flare  Musculoskeletal: Negative for back pain.    Past Medical History  Diagnosis Date  . Allergy   . Diabetes mellitus without complication (HCC)     Gestational Diabetes    Social History   Social History  . Marital Status: Married    Spouse Name: N/A  . Number of Children: N/A  . Years of Education: N/A   Occupational History  . Not on file.   Social History Main Topics  . Smoking status: Former Games developer  . Smokeless tobacco: Never Used  . Alcohol Use: No  . Drug Use: No  . Sexual Activity: Not on file   Other Topics Concern  . Not on file   Social History Narrative    Past Surgical History  Procedure Laterality Date  . Cholecystectomy    . Cesarean section    . Ablation    . Dental surgery      Teeth removed for Dentures    Family History  Problem Relation Age of Onset  . Hyperlipidemia Mother   . Diabetes Mother   . Heart disease Mother   . Bipolar disorder Mother   . Stroke Father   . Glaucoma Father     Allergies  Allergen Reactions  . Penicillins     Yeast infections(side effect)    No  current outpatient prescriptions on file prior to visit.   No current facility-administered medications on file prior to visit.    BP 110/68 mmHg  Pulse 67  Temp(Src) 97.8 F (36.6 C) (Oral)  Ht  (1.651 m)  Wt 164 lb 6.4 oz (74.571 kg)  BMI 27.36 kg/m2  SpO2 98%       Objective:   Physical Exam  General Appearance- Not in acute distress.  HEENT Eyes- Scleraeral/Conjuntiva-bilat- Not Yellow. Mouth & Throat- Normal.  Review/exam of ear normal both sides.  Chest and Lung Exam Auscultation: Breath sounds:-Normal. Adventitious sounds:- No Adventitious sounds.  Cardiovascular Auscultation:Rythm - Regular. Heart Sounds -Normal heart sounds.  Abdomen Inspection:-Inspection Normal.  Palpation/Perucssion: Palpation and Percussion of the abdomen reveal- Non Tender, No Rebound tenderness, No rigidity(Guarding) and No Palpable abdominal masses.  Liver:-Normal.  Spleen:- Normal.   Rectal Anorectal Exam: Stool -. External - at at 3 oclock position 2 hemorrhoids about 12 mm wide. At 9 oclock position one slightly larger.      Assessment & Plan:  For your hemorrhoids I will rx anusol hc suppository and you can take sitz baths.  For brief pain as medicine starts to work rx tramadol  If hemorrhoids persist or worsen despite the above notify us.  Recommend  otc dulcolax to soften stools/reduce strain  Would like to remind you that if you have any recurrent ear issues then would refer you again to ENT since you missed /could not coordinate prior appointment.  Went ahead and put in screening colonoscopy.  Follow up in 7 days or as needed  Pt states her hemorrhoids flare down but never completley.   Asked her to update us on Monday how she is.

## 2015-05-07 NOTE — Patient Instructions (Addendum)
For your hemorrhoids I will rx anusol hc suppository and you can take sitz baths.  For brief pain as medicine starts to work rx tramadol  If hemorrhoids persist or worsen despite the above notify us.  Recommend  otc dulcolax to soften stools/reduce strain  Would like to remind you that if you have any recurrent ear issues then would refer you again to ENT since you missed /could not coordinate prior appointment.  Went ahead and put in screening colonoscopy.  Follow up in 7 days or as needed

## 2015-05-07 NOTE — Progress Notes (Signed)
Pre visit review using our clinic review tool, if applicable. No additional management support is needed unless otherwise documented below in the visit note. 

## 2015-06-10 ENCOUNTER — Telehealth: Payer: Self-pay

## 2015-06-10 NOTE — Telephone Encounter (Signed)
Pre-Visit call made to patient. Left message for return call to update chart info.

## 2015-06-11 ENCOUNTER — Ambulatory Visit (INDEPENDENT_AMBULATORY_CARE_PROVIDER_SITE_OTHER): Payer: BLUE CROSS/BLUE SHIELD | Admitting: Medical

## 2015-06-11 ENCOUNTER — Encounter: Payer: Self-pay | Admitting: Medical

## 2015-06-11 VITALS — BP 108/72 | HR 55 | Temp 98.1°F | Ht 65.0 in | Wt 165.2 lb

## 2015-06-11 DIAGNOSIS — Z1239 Encounter for other screening for malignant neoplasm of breast: Secondary | ICD-10-CM | POA: Diagnosis not present

## 2015-06-11 DIAGNOSIS — N951 Menopausal and female climacteric states: Secondary | ICD-10-CM

## 2015-06-11 DIAGNOSIS — R232 Flushing: Secondary | ICD-10-CM

## 2015-06-11 DIAGNOSIS — Z113 Encounter for screening for infections with a predominantly sexual mode of transmission: Secondary | ICD-10-CM | POA: Diagnosis not present

## 2015-06-11 DIAGNOSIS — Z0000 Encounter for general adult medical examination without abnormal findings: Secondary | ICD-10-CM | POA: Diagnosis not present

## 2015-06-11 LAB — POC URINALSYSI DIPSTICK (AUTOMATED)
BILIRUBIN UA: NEGATIVE
Blood, UA: NEGATIVE
Glucose, UA: NEGATIVE
Ketones, UA: NEGATIVE
Leukocytes, UA: NEGATIVE
Nitrite, UA: NEGATIVE
Protein, UA: NEGATIVE
Urobilinogen, UA: 0.2
pH, UA: 5

## 2015-06-11 LAB — COMPREHENSIVE METABOLIC PANEL
ALT: 37 U/L — ABNORMAL HIGH (ref 0–35)
AST: 23 U/L (ref 0–37)
Albumin: 4.2 g/dL (ref 3.5–5.2)
Alkaline Phosphatase: 85 U/L (ref 39–117)
BUN: 10 mg/dL (ref 6–23)
CALCIUM: 9.5 mg/dL (ref 8.4–10.5)
CHLORIDE: 110 meq/L (ref 96–112)
CO2: 29 meq/L (ref 19–32)
Creatinine, Ser: 0.95 mg/dL (ref 0.40–1.20)
GFR: 66.2 mL/min (ref >60.00)
GLUCOSE: 146 mg/dL — AB (ref 70–99)
Potassium: 4.3 mEq/L (ref 3.5–5.1)
Sodium: 145 mEq/L (ref 135–145)
Total Bilirubin: 0.3 mg/dL (ref 0.2–1.2)
Total Protein: 6.7 g/dL (ref 6.0–8.3)

## 2015-06-11 LAB — LIPID PANEL
CHOL/HDL RATIO: 4
CHOLESTEROL: 163 mg/dL (ref 0–200)
HDL: 37.7 mg/dL — ABNORMAL LOW (ref >39.00)
LDL CALC: 94 mg/dL (ref 0–99)
NonHDL: 125.03
TRIGLYCERIDES: 157 mg/dL — AB (ref 0.0–149.0)
VLDL: 31.4 mg/dL (ref 0.0–40.0)

## 2015-06-11 LAB — CBC WITH DIFFERENTIAL/PLATELET
BASOS ABS: 0 10*3/uL (ref 0.0–0.1)
BASOS PCT: 0.5 % (ref 0.0–3.0)
EOS ABS: 0.2 10*3/uL (ref 0.0–0.7)
Eosinophils Relative: 3.7 % (ref 0.0–5.0)
HEMATOCRIT: 42.5 % (ref 36.0–46.0)
Hemoglobin: 14.2 g/dL (ref 12.0–15.0)
LYMPHS ABS: 1.8 10*3/uL (ref 0.7–4.0)
LYMPHS PCT: 28.5 % (ref 12.0–46.0)
MCHC: 33.5 g/dL (ref 30.0–36.0)
MCV: 88.1 fl (ref 78.0–100.0)
Monocytes Absolute: 0.5 10*3/uL (ref 0.1–1.0)
Monocytes Relative: 7.6 % (ref 3.0–12.0)
NEUTROS ABS: 3.7 10*3/uL (ref 1.4–7.7)
NEUTROS PCT: 59.7 % (ref 43.0–77.0)
PLATELETS: 187 10*3/uL (ref 150.0–400.0)
RBC: 4.82 Mil/uL (ref 3.87–5.11)
RDW: 12.3 % (ref 11.5–15.5)
WBC: 6.2 10*3/uL (ref 4.0–10.5)

## 2015-06-11 LAB — FOLLICLE STIMULATING HORMONE: FSH: 40.7 m[IU]/mL

## 2015-06-11 LAB — TSH: TSH: 1.14 u[IU]/mL (ref 0.35–4.50)

## 2015-06-11 NOTE — Progress Notes (Signed)
Pre visit review using our clinic review tool, if applicable. No additional management support is needed unless otherwise documented below in the visit note. 

## 2015-06-11 NOTE — Assessment & Plan Note (Signed)
Cbc, cmp, tsh, lipid ua today. Also get HIV screen. Will place mammogram order today.

## 2015-06-11 NOTE — Patient Instructions (Signed)
Wellness examination Cbc, cmp, tsh, lipid ua today. Also get HIV screen. Will place mammogram order today.     Will get fsh today to assess if elevated/matching menopause. I may refer you to gyn for evaluation of spotting for possible endometrial biopsy.  If your occasional neck pain and shoulder pain returns let us know and will get xrays.  Follow up date to be determined post lab review.  Preventive Care for Adults, Female A healthy lifestyle and preventive care can promote health and wellness. Preventive health guidelines for women include the following key practices.  A routine yearly physical is a good way to check with your health care provider about your health and preventive screening. It is a chance to share any concerns and updates on your health and to receive a thorough exam.  Visit your dentist for a routine exam and preventive care every 6 months. Brush your teeth twice a day and floss once a day. Good oral hygiene prevents tooth decay and gum disease.  The frequency of eye exams is based on your age, health, family medical history, use of contact lenses, and other factors. Follow your health care provider's recommendations for frequency of eye exams.  Eat a healthy diet. Foods like vegetables, fruits, whole grains, low-fat dairy products, and lean protein foods contain the nutrients you need without too many calories. Decrease your intake of foods high in solid fats, added sugars, and salt. Eat the right amount of calories for you.Get information about a proper diet from your health care provider, if necessary.  Regular physical exercise is one of the most important things you can do for your health. Most adults should get at least 150 minutes of moderate-intensity exercise (any activity that increases your heart rate and causes you to sweat) each week. In addition, most adults need muscle-strengthening exercises on 2 or more days a week.  Maintain a healthy weight. The body  mass index (BMI) is a screening tool to identify possible weight problems. It provides an estimate of body fat based on height and weight. Your health care provider can find your BMI and can help you achieve or maintain a healthy weight.For adults 20 years and older:  A BMI below 18.5 is considered underweight.  A BMI of 18.5 to 24.9 is normal.  A BMI of 25 to 29.9 is considered overweight.  A BMI of 30 and above is considered obese.  Maintain normal blood lipids and cholesterol levels by exercising and minimizing your intake of saturated fat. Eat a balanced diet with plenty of fruit and vegetables. Blood tests for lipids and cholesterol should begin at age 69 and be repeated every 5 years. If your lipid or cholesterol levels are high, you are over 50, or you are at high risk for heart disease, you may need your cholesterol levels checked more frequently.Ongoing high lipid and cholesterol levels should be treated with medicines if diet and exercise are not working.  If you smoke, find out from your health care provider how to quit. If you do not use tobacco, do not start.  Lung cancer screening is recommended for adults aged 77-80 years who are at high risk for developing lung cancer because of a history of smoking. A yearly low-dose CT scan of the lungs is recommended for people who have at least a 30-pack-year history of smoking and are a current smoker or have quit within the past 15 years. A pack year of smoking is smoking an average of 1 pack  of cigarettes a day for 1 year (for example: 1 pack a day for 30 years or 2 packs a day for 15 years). Yearly screening should continue until the smoker has stopped smoking for at least 15 years. Yearly screening should be stopped for people who develop a health problem that would prevent them from having lung cancer treatment.  If you are pregnant, do not drink alcohol. If you are breastfeeding, be very cautious about drinking alcohol. If you are not  pregnant and choose to drink alcohol, do not have more than 1 drink per day. One drink is considered to be 12 ounces (355 mL) of beer, 5 ounces (148 mL) of wine, or 1.5 ounces (44 mL) of liquor.  Avoid use of street drugs. Do not share needles with anyone. Ask for help if you need support or instructions about stopping the use of drugs.  High blood pressure causes heart disease and increases the risk of stroke. Your blood pressure should be checked at least every 1 to 2 years. Ongoing high blood pressure should be treated with medicines if weight loss and exercise do not work.  If you are 25-43 years old, ask your health care provider if you should take aspirin to prevent strokes.  Diabetes screening is done by taking a blood sample to check your blood glucose level after you have not eaten for a certain period of time (fasting). If you are not overweight and you do not have risk factors for diabetes, you should be screened once every 3 years starting at age 84. If you are overweight or obese and you are 10-33 years of age, you should be screened for diabetes every year as part of your cardiovascular risk assessment.  Breast cancer screening is essential preventive care for women. You should practice "breast self-awareness." This means understanding the normal appearance and feel of your breasts and may include breast self-examination. Any changes detected, no matter how small, should be reported to a health care provider. Women in their 24s and 30s should have a clinical breast exam (CBE) by a health care provider as part of a regular health exam every 1 to 3 years. After age 52, women should have a CBE every year. Starting at age 66, women should consider having a mammogram (breast X-ray test) every year. Women who have a family history of breast cancer should talk to their health care provider about genetic screening. Women at a high risk of breast cancer should talk to their health care providers about  having an MRI and a mammogram every year.  Breast cancer gene (BRCA)-related cancer risk assessment is recommended for women who have family members with BRCA-related cancers. BRCA-related cancers include breast, ovarian, tubal, and peritoneal cancers. Having family members with these cancers may be associated with an increased risk for harmful changes (mutations) in the breast cancer genes BRCA1 and BRCA2. Results of the assessment will determine the need for genetic counseling and BRCA1 and BRCA2 testing.  Your health care provider may recommend that you be screened regularly for cancer of the pelvic organs (ovaries, uterus, and vagina). This screening involves a pelvic examination, including checking for microscopic changes to the surface of your cervix (Pap test). You may be encouraged to have this screening done every 3 years, beginning at age 70.  For women ages 20-65, health care providers may recommend pelvic exams and Pap testing every 3 years, or they may recommend the Pap and pelvic exam, combined with testing for human papilloma  virus (HPV), every 5 years. Some types of HPV increase your risk of cervical cancer. Testing for HPV may also be done on women of any age with unclear Pap test results.  Other health care providers may not recommend any screening for nonpregnant women who are considered low risk for pelvic cancer and who do not have symptoms. Ask your health care provider if a screening pelvic exam is right for you.  If you have had past treatment for cervical cancer or a condition that could lead to cancer, you need Pap tests and screening for cancer for at least 20 years after your treatment. If Pap tests have been discontinued, your risk factors (such as having a new sexual partner) need to be reassessed to determine if screening should resume. Some women have medical problems that increase the chance of getting cervical cancer. In these cases, your health care provider may recommend  more frequent screening and Pap tests.  Colorectal cancer can be detected and often prevented. Most routine colorectal cancer screening begins at the age of 13 years and continues through age 35 years. However, your health care provider may recommend screening at an earlier age if you have risk factors for colon cancer. On a yearly basis, your health care provider may provide home test kits to check for hidden blood in the stool. Use of a small camera at the end of a tube, to directly examine the colon (sigmoidoscopy or colonoscopy), can detect the earliest forms of colorectal cancer. Talk to your health care provider about this at age 9, when routine screening begins. Direct exam of the colon should be repeated every 5-10 years through age 43 years, unless early forms of precancerous polyps or small growths are found.  People who are at an increased risk for hepatitis B should be screened for this virus. You are considered at high risk for hepatitis B if:  You were born in a country where hepatitis B occurs often. Talk with your health care provider about which countries are considered high risk.  Your parents were born in a high-risk country and you have not received a shot to protect against hepatitis B (hepatitis B vaccine).  You have HIV or AIDS.  You use needles to inject street drugs.  You live with, or have sex with, someone who has hepatitis B.  You get hemodialysis treatment.  You take certain medicines for conditions like cancer, organ transplantation, and autoimmune conditions.  Hepatitis C blood testing is recommended for all people born from [birth year] through 1965 and any individual with known risks for hepatitis C.  Practice safe sex. Use condoms and avoid high-risk sexual practices to reduce the spread of sexually transmitted infections (STIs). STIs include gonorrhea, chlamydia, syphilis, trichomonas, herpes, HPV, and human immunodeficiency virus (HIV). Herpes, HIV, and HPV are  viral illnesses that have no cure. They can result in disability, cancer, and death.  You should be screened for sexually transmitted illnesses (STIs) including gonorrhea and chlamydia if:  You are sexually active and are younger than 24 years.  You are older than 24 years and your health care provider tells you that you are at risk for this type of infection.  Your sexual activity has changed since you were last screened and you are at an increased risk for chlamydia or gonorrhea. Ask your health care provider if you are at risk.  If you are at risk of being infected with HIV, it is recommended that you take a prescription medicine daily  to prevent HIV infection. This is called preexposure prophylaxis (PrEP). You are considered at risk if:  You are sexually active and do not regularly use condoms or know the HIV status of your partner(s).  You take drugs by injection.  You are sexually active with a partner who has HIV.  Talk with your health care provider about whether you are at high risk of being infected with HIV. If you choose to begin PrEP, you should first be tested for HIV. You should then be tested every 3 months for as long as you are taking PrEP.  Osteoporosis is a disease in which the bones lose minerals and strength with aging. This can result in serious bone fractures or breaks. The risk of osteoporosis can be identified using a bone density scan. Women ages 55 years and over and women at risk for fractures or osteoporosis should discuss screening with their health care providers. Ask your health care provider whether you should take a calcium supplement or vitamin D to reduce the rate of osteoporosis.  Menopause can be associated with physical symptoms and risks. Hormone replacement therapy is available to decrease symptoms and risks. You should talk to your health care provider about whether hormone replacement therapy is right for you.  Use sunscreen. Apply sunscreen  liberally and repeatedly throughout the day. You should seek shade when your shadow is shorter than you. Protect yourself by wearing long sleeves, pants, a wide-brimmed hat, and sunglasses year round, whenever you are outdoors.  Once a month, do a whole body skin exam, using a mirror to look at the skin on your back. Tell your health care provider of new moles, moles that have irregular borders, moles that are larger than a pencil eraser, or moles that have changed in shape or color.  Stay current with required vaccines (immunizations).  Influenza vaccine. All adults should be immunized every year.  Tetanus, diphtheria, and acellular pertussis (Td, Tdap) vaccine. Pregnant women should receive 1 dose of Tdap vaccine during each pregnancy. The dose should be obtained regardless of the length of time since the last dose. Immunization is preferred during the 27th-36th week of gestation. An adult who has not previously received Tdap or who does not know her vaccine status should receive 1 dose of Tdap. This initial dose should be followed by tetanus and diphtheria toxoids (Td) booster doses every 10 years. Adults with an unknown or incomplete history of completing a 3-dose immunization series with Td-containing vaccines should begin or complete a primary immunization series including a Tdap dose. Adults should receive a Td booster every 10 years.  Varicella vaccine. An adult without evidence of immunity to varicella should receive 2 doses or a second dose if she has previously received 1 dose. Pregnant females who do not have evidence of immunity should receive the first dose after pregnancy. This first dose should be obtained before leaving the health care facility. The second dose should be obtained 4-8 weeks after the first dose.  Human papillomavirus (HPV) vaccine. Females aged 13-26 years who have not received the vaccine previously should obtain the 3-dose series. The vaccine is not recommended for use  in pregnant females. However, pregnancy testing is not needed before receiving a dose. If a female is found to be pregnant after receiving a dose, no treatment is needed. In that case, the remaining doses should be delayed until after the pregnancy. Immunization is recommended for any person with an immunocompromised condition through the age of 45 years if  she did not get any or all doses earlier. During the 3-dose series, the second dose should be obtained 4-8 weeks after the first dose. The third dose should be obtained 24 weeks after the first dose and 16 weeks after the second dose.  Zoster vaccine. One dose is recommended for adults aged 11 years or older unless certain conditions are present.  Measles, mumps, and rubella (MMR) vaccine. Adults born before [birth year] generally are considered immune to measles and mumps. Adults born in [birth year] or later should have 1 or more doses of MMR vaccine unless there is a contraindication to the vaccine or there is laboratory evidence of immunity to each of the three diseases. A routine second dose of MMR vaccine should be obtained at least 28 days after the first dose for students attending postsecondary schools, health care workers, or international travelers. People who received inactivated measles vaccine or an unknown type of measles vaccine during 1963-1967 should receive 2 doses of MMR vaccine. People who received inactivated mumps vaccine or an unknown type of mumps vaccine before 1979 and are at high risk for mumps infection should consider immunization with 2 doses of MMR vaccine. For females of childbearing age, rubella immunity should be determined. If there is no evidence of immunity, females who are not pregnant should be vaccinated. If there is no evidence of immunity, females who are pregnant should delay immunization until after pregnancy. Unvaccinated health care workers born before [birth year] who lack laboratory evidence of measles, mumps, or rubella immunity or  laboratory confirmation of disease should consider measles and mumps immunization with 2 doses of MMR vaccine or rubella immunization with 1 dose of MMR vaccine.  Pneumococcal 13-valent conjugate (PCV13) vaccine. When indicated, a person who is uncertain of his immunization history and has no record of immunization should receive the PCV13 vaccine. All adults 50 years of age and older should receive this vaccine. An adult aged 48 years or older who has certain medical conditions and has not been previously immunized should receive 1 dose of PCV13 vaccine. This PCV13 should be followed with a dose of pneumococcal polysaccharide (PPSV23) vaccine. Adults who are at high risk for pneumococcal disease should obtain the PPSV23 vaccine at least 8 weeks after the dose of PCV13 vaccine. Adults older than 50 years of age who have normal immune system function should obtain the PPSV23 vaccine dose at least 1 year after the dose of PCV13 vaccine.  Pneumococcal polysaccharide (PPSV23) vaccine. When PCV13 is also indicated, PCV13 should be obtained first. All adults aged 98 years and older should be immunized. An adult younger than age 73 years who has certain medical conditions should be immunized. Any person who resides in a nursing home or long-term care facility should be immunized. An adult smoker should be immunized. People with an immunocompromised condition and certain other conditions should receive both PCV13 and PPSV23 vaccines. People with human immunodeficiency virus (HIV) infection should be immunized as soon as possible after diagnosis. Immunization during chemotherapy or radiation therapy should be avoided. Routine use of PPSV23 vaccine is not recommended for American Indians, [place] Natives, or people younger than 65 years unless there are medical conditions that require PPSV23 vaccine. When indicated, people who have unknown immunization and have no record of immunization should receive PPSV23 vaccine.  One-time revaccination 5 years after the first dose of PPSV23 is recommended for people aged 19-64 years who have chronic kidney failure, nephrotic syndrome, asplenia, or immunocompromised conditions. People who received 1-2 doses of  PPSV23 before age 44 years should receive another dose of PPSV23 vaccine at age 78 years or later if at least 5 years have passed since the previous dose. Doses of PPSV23 are not needed for people immunized with PPSV23 at or after age 71 years.  Meningococcal vaccine. Adults with asplenia or persistent complement component deficiencies should receive 2 doses of quadrivalent meningococcal conjugate (MenACWY-D) vaccine. The doses should be obtained at least 2 months apart. Microbiologists working with certain meningococcal bacteria, [employer] recruits, people at risk during an outbreak, and people who travel to or live in countries with a high rate of meningitis should be immunized. A first-year college student up through age 44 years who is living in a residence hall should receive a dose if she did not receive a dose on or after her 16th birthday. Adults who have certain high-risk conditions should receive one or more doses of vaccine.  Hepatitis A vaccine. Adults who wish to be protected from this disease, have certain high-risk conditions, work with hepatitis A-infected animals, work in hepatitis A research labs, or travel to or work in countries with a high rate of hepatitis A should be immunized. Adults who were previously unvaccinated and who anticipate close contact with an international adoptee during the first 60 days after arrival in the [place] from a country with a high rate of hepatitis A should be immunized.  Hepatitis B vaccine. Adults who wish to be protected from this disease, have certain high-risk conditions, may be exposed to blood or other infectious body fluids, are household contacts or sex partners of hepatitis B positive people, are clients or workers  in certain care facilities, or travel to or work in countries with a high rate of hepatitis B should be immunized.  Haemophilus influenzae type b (Hib) vaccine. A previously unvaccinated person with asplenia or sickle cell disease or having a scheduled splenectomy should receive 1 dose of Hib vaccine. Regardless of previous immunization, a recipient of a hematopoietic stem cell transplant should receive a 3-dose series 6-12 months after her successful transplant. Hib vaccine is not recommended for adults with HIV infection. Preventive Services / Frequency Ages 61 to 70 years  Blood pressure check.** / Every 3-5 years.  Lipid and cholesterol check.** / Every 5 years beginning at age 46.  Clinical breast exam.** / Every 3 years for women in their 27s and 73s.  BRCA-related cancer risk assessment.** / For women who have family members with a BRCA-related cancer (breast, ovarian, tubal, or peritoneal cancers).  Pap test.** / Every 2 years from ages 17 through 36. Every 3 years starting at age 1 through age 78 or 40 with a history of 3 consecutive normal Pap tests.  HPV screening.** / Every 3 years from ages 52 through ages 25 to 64 with a history of 3 consecutive normal Pap tests.  Hepatitis C blood test.** / For any individual with known risks for hepatitis C.  Skin self-exam. / Monthly.  Influenza vaccine. / Every year.  Tetanus, diphtheria, and acellular pertussis (Tdap, Td) vaccine.** / Consult your health care provider. Pregnant women should receive 1 dose of Tdap vaccine during each pregnancy. 1 dose of Td every 10 years.  Varicella vaccine.** / Consult your health care provider. Pregnant females who do not have evidence of immunity should receive the first dose after pregnancy.  HPV vaccine. / 3 doses over 6 months, if 46 and younger. The vaccine is not recommended for use in pregnant females. However, pregnancy testing  is not needed before receiving a dose.  Measles, mumps, rubella  (MMR) vaccine.** / You need at least 1 dose of MMR if you were born in 1957 or later. You may also need a 2nd dose. For females of childbearing age, rubella immunity should be determined. If there is no evidence of immunity, females who are not pregnant should be vaccinated. If there is no evidence of immunity, females who are pregnant should delay immunization until after pregnancy.  Pneumococcal 13-valent conjugate (PCV13) vaccine.** / Consult your health care provider.  Pneumococcal polysaccharide (PPSV23) vaccine.** / 1 to 2 doses if you smoke cigarettes or if you have certain conditions.  Meningococcal vaccine.** / 1 dose if you are age 67 to 49 years and a Market researcher living in a residence hall, or have one of several medical conditions, you need to get vaccinated against meningococcal disease. You may also need additional booster doses.  Hepatitis A vaccine.** / Consult your health care provider.  Hepatitis B vaccine.** / Consult your health care provider.  Haemophilus influenzae type b (Hib) vaccine.** / Consult your health care provider. Ages 31 to 57 years  Blood pressure check.** / Every year.  Lipid and cholesterol check.** / Every 5 years beginning at age 28 years.  Lung cancer screening. / Every year if you are aged 43-80 years and have a 30-pack-year history of smoking and currently smoke or have quit within the past 15 years. Yearly screening is stopped once you have quit smoking for at least 15 years or develop a health problem that would prevent you from having lung cancer treatment.  Clinical breast exam.** / Every year after age 51 years.  BRCA-related cancer risk assessment.** / For women who have family members with a BRCA-related cancer (breast, ovarian, tubal, or peritoneal cancers).  Mammogram.** / Every year beginning at age 32 years and continuing for as long as you are in good health. Consult with your health care provider.  Pap test.** / Every 3  years starting at age 72 years through age 16 or 69 years with a history of 3 consecutive normal Pap tests.  HPV screening.** / Every 3 years from ages 22 years through ages 67 to 19 years with a history of 3 consecutive normal Pap tests.  Fecal occult blood test (FOBT) of stool. / Every year beginning at age 64 years and continuing until age 64 years. You may not need to do this test if you get a colonoscopy every 10 years.  Flexible sigmoidoscopy or colonoscopy.** / Every 5 years for a flexible sigmoidoscopy or every 10 years for a colonoscopy beginning at age 73 years and continuing until age 81 years.  Hepatitis C blood test.** / For all people born from [birth year] through 1965 and any individual with known risks for hepatitis C.  Skin self-exam. / Monthly.  Influenza vaccine. / Every year.  Tetanus, diphtheria, and acellular pertussis (Tdap/Td) vaccine.** / Consult your health care provider. Pregnant women should receive 1 dose of Tdap vaccine during each pregnancy. 1 dose of Td every 10 years.  Varicella vaccine.** / Consult your health care provider. Pregnant females who do not have evidence of immunity should receive the first dose after pregnancy.  Zoster vaccine.** / 1 dose for adults aged 24 years or older.  Measles, mumps, rubella (MMR) vaccine.** / You need at least 1 dose of MMR if you were born in 1957 or later. You may also need a second dose. For females of childbearing age, rubella  immunity should be determined. If there is no evidence of immunity, females who are not pregnant should be vaccinated. If there is no evidence of immunity, females who are pregnant should delay immunization until after pregnancy.  Pneumococcal 13-valent conjugate (PCV13) vaccine.** / Consult your health care provider.  Pneumococcal polysaccharide (PPSV23) vaccine.** / 1 to 2 doses if you smoke cigarettes or if you have certain conditions.  Meningococcal vaccine.** / Consult your health care  provider.  Hepatitis A vaccine.** / Consult your health care provider.  Hepatitis B vaccine.** / Consult your health care provider.  Haemophilus influenzae type b (Hib) vaccine.** / Consult your health care provider. Ages 66 years and over  Blood pressure check.** / Every year.  Lipid and cholesterol check.** / Every 5 years beginning at age 26 years.  Lung cancer screening. / Every year if you are aged 7-80 years and have a 30-pack-year history of smoking and currently smoke or have quit within the past 15 years. Yearly screening is stopped once you have quit smoking for at least 15 years or develop a health problem that would prevent you from having lung cancer treatment.  Clinical breast exam.** / Every year after age 19 years.  BRCA-related cancer risk assessment.** / For women who have family members with a BRCA-related cancer (breast, ovarian, tubal, or peritoneal cancers).  Mammogram.** / Every year beginning at age 68 years and continuing for as long as you are in good health. Consult with your health care provider.  Pap test.** / Every 3 years starting at age 36 years through age 10 or 7 years with 3 consecutive normal Pap tests. Testing can be stopped between 65 and 70 years with 3 consecutive normal Pap tests and no abnormal Pap or HPV tests in the past 10 years.  HPV screening.** / Every 3 years from ages 17 years through ages 23 or 48 years with a history of 3 consecutive normal Pap tests. Testing can be stopped between 65 and 70 years with 3 consecutive normal Pap tests and no abnormal Pap or HPV tests in the past 10 years.  Fecal occult blood test (FOBT) of stool. / Every year beginning at age 38 years and continuing until age 89 years. You may not need to do this test if you get a colonoscopy every 10 years.  Flexible sigmoidoscopy or colonoscopy.** / Every 5 years for a flexible sigmoidoscopy or every 10 years for a colonoscopy beginning at age 36 years and continuing  until age 11 years.  Hepatitis C blood test.** / For all people born from [birth year] through 1965 and any individual with known risks for hepatitis C.  Osteoporosis screening.** / A one-time screening for women ages 62 years and over and women at risk for fractures or osteoporosis.  Skin self-exam. / Monthly.  Influenza vaccine. / Every year.  Tetanus, diphtheria, and acellular pertussis (Tdap/Td) vaccine.** / 1 dose of Td every 10 years.  Varicella vaccine.** / Consult your health care provider.  Zoster vaccine.** / 1 dose for adults aged 1 years or older.  Pneumococcal 13-valent conjugate (PCV13) vaccine.** / Consult your health care provider.  Pneumococcal polysaccharide (PPSV23) vaccine.** / 1 dose for all adults aged 74 years and older.  Meningococcal vaccine.** / Consult your health care provider.  Hepatitis A vaccine.** / Consult your health care provider.  Hepatitis B vaccine.** / Consult your health care provider.  Haemophilus influenzae type b (Hib) vaccine.** / Consult your health care provider. ** Family history and personal history  of risk and conditions may change your health care provider's recommendations.   This information is not intended to replace advice given to you by your health care provider. Make sure you discuss any questions you have with your health care provider.   Document Released: 03/28/2001 Document Revised: 02/20/2014 Document Reviewed: 06/27/2010 Elsevier Interactive Patient Education [employer].
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
## 2015-06-11 NOTE — Addendum Note (Signed)
Addended by: Neldon LabellaMABE, Jahshua Bonito S on: 06/11/2015 09:36 AM   Modules accepted: Orders

## 2015-06-12 LAB — HIV ANTIBODY (ROUTINE TESTING W REFLEX): HIV 1&2 Ab, 4th Generation: NONREACTIVE

## 2015-06-13 ENCOUNTER — Telehealth: Payer: Self-pay | Admitting: Medical

## 2015-06-13 DIAGNOSIS — R232 Flushing: Secondary | ICD-10-CM

## 2015-06-13 DIAGNOSIS — N951 Menopausal and female climacteric states: Secondary | ICD-10-CM

## 2015-06-13 NOTE — Telephone Encounter (Signed)
referal to gyn placed. 

## 2015-06-14 NOTE — Telephone Encounter (Signed)
[facility]

## 2015-06-28 ENCOUNTER — Telehealth: Payer: Self-pay | Admitting: Medical

## 2015-06-28 ENCOUNTER — Encounter: Payer: Self-pay | Admitting: Medical

## 2015-06-28 NOTE — Telephone Encounter (Signed)
Left message on voicemail for patient to call the office and reschedule appointment with Esperanza RichtersEdward Saguier scheduled for September 17, 2015. Letter mailed and appointment cancelled 5/15

## 2015-08-06 ENCOUNTER — Telehealth: Payer: Self-pay | Admitting: Medical

## 2015-08-06 MED ORDER — HYDROCORTISONE ACETATE 25 MG RE SUPP: 25.0000 mg | Freq: Two times a day (BID) | RECTAL | Status: DC

## 2015-08-06 NOTE — Telephone Encounter (Signed)
Please advise 

## 2015-08-06 NOTE — Telephone Encounter (Signed)
°  Relationship to patient: Self  Can be reached: [phone]  Pharmacy:  [id number] - [place], [place] - [address] [phone] (Phone)         Reason for call: Request refill on hydrocortisone (ANUSOL-HC) 25 MG suppository [[id number]][[id number]]

## 2015-08-06 NOTE — Telephone Encounter (Signed)
Medication filled to pharmacy as requested. Called pt and left detailed message advising her of below.

## 2015-08-06 NOTE — Telephone Encounter (Signed)
Advise patient: Will send  #10 no refills but if she has persistent  symptoms needs to discuss with PCP

## 2015-08-17 ENCOUNTER — Telehealth: Payer: Self-pay | Admitting: Medical

## 2015-08-17 NOTE — Telephone Encounter (Signed)
Back in April I put in order for US breast and mammogram. Reminder came up that pt has not had test done. Will you investigate why not. Was pt notified. Or was test never scheduled.

## 2015-08-18 NOTE — Telephone Encounter (Signed)
Attempted to reach pt and VM was not available at this time. The recording was not a personal VM. Reminder letter mailed to pt to call and set up a mammogram.

## 2015-09-17 ENCOUNTER — Ambulatory Visit: Payer: BLUE CROSS/BLUE SHIELD | Admitting: Medical

## 2015-09-20 ENCOUNTER — Telehealth: Payer: Self-pay | Admitting: Medical

## 2015-09-20 NOTE — Telephone Encounter (Signed)
I ordered US of pt breast and mammogram. She never got those done. Will you call pt to find out why she did not get these done? Such a delay that ordersare now classified as expired.

## 2015-09-21 NOTE — Telephone Encounter (Signed)
Left message for pt to call back  °

## 2015-09-21 NOTE — Telephone Encounter (Signed)
Letter has been mailed to pt as a reminder to call and set up her yearly Mammogram.

## 2015-09-23 ENCOUNTER — Ambulatory Visit (HOSPITAL_BASED_OUTPATIENT_CLINIC_OR_DEPARTMENT_OTHER)
Admission: RE | Admit: 2015-09-23 | Discharge: 2015-09-23 | Disposition: A | Discharge status: Home / Self Care | Payer: BLUE CROSS/BLUE SHIELD | Source: Ambulatory Visit | Attending: Medical | Admitting: Medical

## 2015-09-23 DIAGNOSIS — R928 Other abnormal and inconclusive findings on diagnostic imaging of breast: Secondary | ICD-10-CM | POA: Diagnosis not present

## 2015-09-23 DIAGNOSIS — Z1239 Encounter for other screening for malignant neoplasm of breast: Secondary | ICD-10-CM | POA: Diagnosis not present

## 2015-09-23 DIAGNOSIS — Z1231 Encounter for screening mammogram for malignant neoplasm of breast: Secondary | ICD-10-CM | POA: Diagnosis not present

## 2015-09-24 ENCOUNTER — Telehealth: Payer: Self-pay | Admitting: Medical

## 2015-09-24 DIAGNOSIS — N6310 Unspecified lump in the right breast, unspecified quadrant: Secondary | ICD-10-CM

## 2015-09-24 NOTE — Telephone Encounter (Signed)
I reviewed her mammogram. And radiologist stated they would contact pt to schedule additional studies. Will you check late next week and see it that has been done. Let me know. If they want me to place order or sign will do so.

## 2015-09-27 NOTE — Telephone Encounter (Signed)
Pt says that she and cma was disconnected while she was holding. She is calling back to follow up.    Please call back.    Thanks.

## 2015-09-27 NOTE — Telephone Encounter (Signed)
Left message for pt to call back about her recent Mammogram results.

## 2015-09-27 NOTE — Telephone Encounter (Signed)
Spoke with pt and advised her that she will recive a phone call from [facility] to set [facility] that [facility]. Pt did not have any further questions.

## 2015-09-27 NOTE — Addendum Note (Signed)
Addended by: Neldon LabellaMABE, Doylene Splinter S on: 09/27/2015 03:28 PM   Modules accepted: Orders

## 2015-10-05 ENCOUNTER — Ambulatory Visit
Admission: RE | Admit: 2015-10-05 | Discharge: 2015-10-05 | Disposition: A | Discharge status: Home / Self Care | Payer: BLUE CROSS/BLUE SHIELD | Source: Ambulatory Visit | Attending: Medical | Admitting: Medical

## 2015-10-05 DIAGNOSIS — N6310 Unspecified lump in the right breast, unspecified quadrant: Secondary | ICD-10-CM

## 2015-10-05 DIAGNOSIS — N63 Unspecified lump in breast: Secondary | ICD-10-CM | POA: Diagnosis not present

## 2015-11-22 ENCOUNTER — Telehealth: Payer: Self-pay | Admitting: Medical

## 2015-11-22 NOTE — Telephone Encounter (Signed)
Opened to review imaging studies for breast exams. Reminder placed.

## 2015-11-26 ENCOUNTER — Telehealth: Payer: Self-pay | Admitting: Medical

## 2015-11-26 NOTE — Telephone Encounter (Signed)
Patient called wanting to get in for an appointment today for a red inflamed spot on her c-section scar. She also has a spot on her leg that is red and irritated as well. Since we do not have any availability today she wanted to make sure that this is not a serious issue and she and she can wait to be scheduled until Monday. She would like to have a return call.    Patient phone: [phone]

## 2015-12-01 NOTE — Telephone Encounter (Signed)
Made follow up call to patient. Left message for return call.

## 2016-02-24 DIAGNOSIS — J019 Acute sinusitis, unspecified: Secondary | ICD-10-CM | POA: Diagnosis not present

## 2016-03-07 DIAGNOSIS — B372 Candidiasis of skin and nail: Secondary | ICD-10-CM | POA: Diagnosis not present

## 2016-03-07 DIAGNOSIS — Z23 Encounter for immunization: Secondary | ICD-10-CM | POA: Diagnosis not present

## 2016-03-22 DIAGNOSIS — H524 Presbyopia: Secondary | ICD-10-CM | POA: Diagnosis not present

## 2016-03-22 DIAGNOSIS — H5203 Hypermetropia, bilateral: Secondary | ICD-10-CM | POA: Diagnosis not present

## 2016-03-22 DIAGNOSIS — H52223 Regular astigmatism, bilateral: Secondary | ICD-10-CM | POA: Diagnosis not present

## 2016-05-09 ENCOUNTER — Telehealth: Payer: Self-pay | Admitting: Medical

## 2016-05-09 NOTE — Telephone Encounter (Signed)
Check with pt. I had reminder that she was supposed to get imaging studies done in February 2018. Has she scheduled this. Have they called her. Let me know what she says. If breast center has not called her to set up and we can help by ordering studies let me know.

## 2016-05-10 NOTE — Telephone Encounter (Signed)
Left message for pt to return my call.

## 2016-06-29 DIAGNOSIS — B373 Candidiasis of vulva and vagina: Secondary | ICD-10-CM | POA: Diagnosis not present

## 2016-06-29 DIAGNOSIS — B9689 Other specified bacterial agents as the cause of diseases classified elsewhere: Secondary | ICD-10-CM | POA: Diagnosis not present

## 2016-06-29 DIAGNOSIS — N760 Acute vaginitis: Secondary | ICD-10-CM | POA: Diagnosis not present

## 2016-06-29 DIAGNOSIS — R300 Dysuria: Secondary | ICD-10-CM | POA: Diagnosis not present

## 2016-07-05 ENCOUNTER — Encounter: Payer: Self-pay | Admitting: Medical

## 2016-07-05 ENCOUNTER — Encounter: Payer: Self-pay | Admitting: Gastroenterology

## 2016-07-05 ENCOUNTER — Other Ambulatory Visit: Payer: Self-pay | Admitting: Medical

## 2016-07-05 ENCOUNTER — Telehealth: Payer: Self-pay | Admitting: Medical

## 2016-07-05 ENCOUNTER — Ambulatory Visit (INDEPENDENT_AMBULATORY_CARE_PROVIDER_SITE_OTHER): Payer: BLUE CROSS/BLUE SHIELD | Admitting: Medical

## 2016-07-05 VITALS — BP 133/69 | HR 61 | Temp 98.0°F | Resp 16 | Ht 65.0 in | Wt 165.8 lb

## 2016-07-05 DIAGNOSIS — R739 Hyperglycemia, unspecified: Secondary | ICD-10-CM

## 2016-07-05 DIAGNOSIS — N63 Unspecified lump in unspecified breast: Secondary | ICD-10-CM

## 2016-07-05 DIAGNOSIS — Z0000 Encounter for general adult medical examination without abnormal findings: Secondary | ICD-10-CM

## 2016-07-05 DIAGNOSIS — L989 Disorder of the skin and subcutaneous tissue, unspecified: Secondary | ICD-10-CM | POA: Diagnosis not present

## 2016-07-05 DIAGNOSIS — Z1211 Encounter for screening for malignant neoplasm of colon: Secondary | ICD-10-CM

## 2016-07-05 LAB — COMPREHENSIVE METABOLIC PANEL
ALBUMIN: 4.3 g/dL (ref 3.5–5.2)
ALK PHOS: 91 U/L (ref 39–117)
ALT: 31 U/L (ref 0–35)
AST: 23 U/L (ref 0–37)
BILIRUBIN TOTAL: 0.4 mg/dL (ref 0.2–1.2)
BUN: 9 mg/dL (ref 6–23)
CALCIUM: 9.1 mg/dL (ref 8.4–10.5)
CHLORIDE: 108 meq/L (ref 96–112)
CO2: 25 mEq/L (ref 19–32)
Creatinine, Ser: 0.89 mg/dL (ref 0.40–1.20)
GFR: 71.07 mL/min (ref >60.00)
Glucose, Bld: 170 mg/dL — ABNORMAL HIGH (ref 70–99)
Potassium: 3.5 mEq/L (ref 3.5–5.1)
SODIUM: 141 meq/L (ref 135–145)
TOTAL PROTEIN: 6.7 g/dL (ref 6.0–8.3)

## 2016-07-05 LAB — CBC WITH DIFFERENTIAL/PLATELET
BASOS PCT: 1.1 % (ref 0.0–3.0)
Basophils Absolute: 0.1 10*3/uL (ref 0.0–0.1)
EOS ABS: 0.3 10*3/uL (ref 0.0–0.7)
EOS PCT: 4 % (ref 0.0–5.0)
HCT: 43.5 % (ref 36.0–46.0)
HEMOGLOBIN: 14.8 g/dL (ref 12.0–15.0)
LYMPHS PCT: 25.4 % (ref 12.0–46.0)
Lymphs Abs: 1.7 10*3/uL (ref 0.7–4.0)
MCHC: 34.1 g/dL (ref 30.0–36.0)
MCV: 88.2 fl (ref 78.0–100.0)
Monocytes Absolute: 0.5 10*3/uL (ref 0.1–1.0)
Monocytes Relative: 7.3 % (ref 3.0–12.0)
Neutro Abs: 4.2 10*3/uL (ref 1.4–7.7)
Neutrophils Relative %: 62.2 % (ref 43.0–77.0)
Platelets: 184 10*3/uL (ref 150.0–400.0)
RBC: 4.94 Mil/uL (ref 3.87–5.11)
RDW: 12.1 % (ref 11.5–15.5)
WBC: 6.7 10*3/uL (ref 4.0–10.5)

## 2016-07-05 LAB — LIPID PANEL
CHOLESTEROL: 177 mg/dL (ref 0–200)
HDL: 30.7 mg/dL — ABNORMAL LOW (ref >39.00)
NonHDL: 146.36
Total CHOL/HDL Ratio: 6
Triglycerides: 214 mg/dL — ABNORMAL HIGH (ref 0.0–149.0)
VLDL: 42.8 mg/dL — AB (ref 0.0–40.0)

## 2016-07-05 LAB — POC URINALSYSI DIPSTICK (AUTOMATED)
Bilirubin, UA: NEGATIVE
Blood, UA: NEGATIVE
GLUCOSE UA: NEGATIVE
Ketones, UA: NEGATIVE
Leukocytes, UA: NEGATIVE
Nitrite, UA: NEGATIVE
PROTEIN UA: NEGATIVE
SPEC GRAV UA: 1.025 (ref 1.010–1.025)
UROBILINOGEN UA: NEGATIVE U/dL — AB
pH, UA: 6 (ref 5.0–8.0)

## 2016-07-05 LAB — LDL CHOLESTEROL, DIRECT: Direct LDL: 122 mg/dL

## 2016-07-05 LAB — TSH: TSH: 1.15 u[IU]/mL (ref 0.35–4.50)

## 2016-07-05 NOTE — Progress Notes (Signed)
Subjective:    Patient ID: Suzanne Moore, female    DOB: Jul 05, 1965, 51 y.o.   MRN: [id number]  HPI  I have reviewed pt PMH, PSH, FH, Social History and Surgical History.  Pt has not been exercising daily recently. Diet fairly healthy. Non smoker. No alcohol use.   Pt did have mammogram in past. She was told to repeat but she never did. She reports no changes in area of concern since last year. Possible rt breast mass.  Last pap smear normal in 2016.  LMP- last May. Pt still having hot flashes.   Pt up to date on tdap. Hiv screen completed.  Pt aware needs colonoscopy. Referral placed last year.     Review of Systems  Constitutional: Negative for chills, fatigue and fever.  HENT: Negative for congestion, ear pain, facial swelling, nosebleeds, postnasal drip, rhinorrhea, sinus pain and sneezing.   Respiratory: Negative for cough, chest tightness, shortness of breath and wheezing.   Cardiovascular: Negative for chest pain and palpitations.  Gastrointestinal: Negative for abdominal pain.  Genitourinary: Negative for decreased urine volume, dyspareunia, enuresis, flank pain, frequency, pelvic pain, urgency and vaginal pain.  Musculoskeletal: Negative for back pain, myalgias and neck stiffness.  Skin: Negative for rash.  Neurological: Negative for dizziness, syncope, speech difficulty, weakness, light-headedness and headaches.  Hematological: Negative for adenopathy. Does not bruise/bleed easily.  Psychiatric/Behavioral: Negative for behavioral problems and confusion.    Past Medical History:  Diagnosis Date  . Allergy   . Diabetes mellitus without complication (HCC)    Gestational Diabetes     Social History   Social History  . Marital status: Married    Spouse name: N/A  . Number of children: N/A  . Years of education: N/A   Occupational History  . Not on file.   Social History Main Topics  . Smoking status: Former Games developer  . Smokeless tobacco: Never Used  .  Alcohol use No  . Drug use: No  . Sexual activity: Not on file   Other Topics Concern  . Not on file   Social History Narrative  . No narrative on file    Past Surgical History:  Procedure Laterality Date  . ABLATION    . CESAREAN SECTION    . CHOLECYSTECTOMY    . DENTAL SURGERY     Teeth removed for Dentures    Family History  Problem Relation Age of Onset  . Hyperlipidemia Mother   . Diabetes Mother   . Heart disease Mother   . Bipolar disorder Mother   . Stroke Father   . Glaucoma Father     Allergies  Allergen Reactions  . Penicillins     Yeast infections(side effect)    Current Outpatient Prescriptions on File Prior to Visit  Medication Sig Dispense Refill  . traMADol (ULTRAM) 50 MG tablet Take 1 tablet (50 mg total) by mouth every 8 (eight) hours as needed. (Patient not taking: Reported on 07/05/2016) 12 tablet 0   No current facility-administered medications on file prior to visit.     BP 133/69 (BP Location: Left Arm, Patient Position: Sitting, Cuff Size: Normal)   Pulse 61   Temp 98 F (36.7 C) (Oral)   Resp 16   Ht 5\' 5"  (1.651 m)   Wt 165 lb 12.8 oz (75.2 kg)   SpO2 99%   BMI 27.59 kg/m       Objective:   Physical Exam  General Mental Status- Alert. General Appearance- Not in acute distress.  Skin General: Color- Normal Color. Moisture- Normal Moisture. With MA inspected back. Scattered moderat large lesion but most small. One moderate in size 8 mm. Raised s keratosis like.  Neck Carotid Arteries- Normal color. Moisture- Normal Moisture. No carotid bruits. No JVD.  Chest and Lung Exam Auscultation: Breath Sounds:-Normal.  Cardiovascular Auscultation:Rythm- Regular. Murmurs & Other Heart Sounds:Auscultation of the heart reveals- No Murmurs.  Abdomen Inspection:-Inspeection Normal. Palpation/Percussion:Note:No mass. Palpation and Percussion of the abdomen reveal- Non Tender, Non Distended + BS, no rebound or  guarding.    Neurologic Cranial Nerve exam:- CN III-XII intact(No nystagmus), symmetric smile. Drift Test:- No drift. .Finger to Nose:- Normal/Intact Strength:- 5/5 equal and symmetric strength both upper and lower extremities.      Assessment & Plan:  For you wellness exam today I have ordered cbc, cmp, tsh, lipid panel, and ua.  Vaccine up to date.  Recommend exercise and healthy diet.  We will let you know lab results as they come in.  I will put in referral to GI for your colonoscopy.  Will also put in your mammogram order.  Follow up date to be determined after lab review.  Regarding some hot flashes at night. I did advise she could try black cohash.By age and no menses likely menopausal.   Idris Edmundson, Ramon DredgeEdward, PA-C

## 2016-07-05 NOTE — Telephone Encounter (Signed)
a1c added and cbc drawn yesterday.

## 2016-07-05 NOTE — Patient Instructions (Addendum)
For you wellness exam today I have ordered cbc, cmp, tsh, lipid panel, and ua.  Vaccine up to date.  Recommend exercise and healthy diet.  We will let you know lab results as they come in.  I will put in referral to GI for your colonoscopy.  Will also put in your mammogram order.  Follow up date to be determined after lab review.

## 2016-07-06 ENCOUNTER — Other Ambulatory Visit (INDEPENDENT_AMBULATORY_CARE_PROVIDER_SITE_OTHER): Payer: BLUE CROSS/BLUE SHIELD

## 2016-07-06 ENCOUNTER — Telehealth: Payer: Self-pay | Admitting: Medical

## 2016-07-06 DIAGNOSIS — R739 Hyperglycemia, unspecified: Secondary | ICD-10-CM | POA: Diagnosis not present

## 2016-07-06 DIAGNOSIS — E119 Type 2 diabetes mellitus without complications: Secondary | ICD-10-CM

## 2016-07-06 LAB — HEMOGLOBIN A1C: Hgb A1c MFr Bld: 7.5 % — ABNORMAL HIGH (ref 4.6–6.5)

## 2016-07-06 MED ORDER — METFORMIN HCL ER 500 MG PO TB24: 500.0000 mg | ORAL_TABLET | Freq: Every day | ORAL | 3 refills | Status: DC

## 2016-07-06 NOTE — Telephone Encounter (Signed)
Add on for A1c faxed to [facility]. KMP 

## 2016-07-06 NOTE — Telephone Encounter (Signed)
Sent metformin rx to pharmacy.

## 2016-08-30 ENCOUNTER — Ambulatory Visit (AMBULATORY_SURGERY_CENTER): Payer: Self-pay | Admitting: *Deleted

## 2016-08-30 VITALS — Ht 65.0 in | Wt 164.6 lb

## 2016-08-30 DIAGNOSIS — Z1211 Encounter for screening for malignant neoplasm of colon: Secondary | ICD-10-CM

## 2016-08-30 MED ORDER — NA SULFATE-K SULFATE-MG SULF 17.5-3.13-1.6 GM/177ML PO SOLN: 1.0000 | Freq: Once | ORAL | 0 refills | Status: AC

## 2016-08-30 NOTE — Progress Notes (Signed)
No egg or soy allergy known to patient  No issues with past sedation with any surgeries  or procedures, no intubation problems  No diet pills per patient No home 02 use per patient  No blood thinners per patient  Pt denies issues with constipation  No A fib or A flutter  EMMI video sent to pt's e mail  15$ coupon to pt for suprep  

## 2016-09-05 ENCOUNTER — Encounter: Payer: Self-pay | Admitting: Gastroenterology

## 2016-09-13 ENCOUNTER — Encounter: Payer: Self-pay | Admitting: Gastroenterology

## 2016-09-13 ENCOUNTER — Ambulatory Visit (AMBULATORY_SURGERY_CENTER): Payer: BLUE CROSS/BLUE SHIELD | Admitting: Gastroenterology

## 2016-09-13 VITALS — BP 104/62 | HR 59 | Temp 98.4°F | Resp 17 | Ht 65.0 in | Wt 165.0 lb

## 2016-09-13 DIAGNOSIS — Z1212 Encounter for screening for malignant neoplasm of rectum: Secondary | ICD-10-CM | POA: Diagnosis not present

## 2016-09-13 DIAGNOSIS — Z1211 Encounter for screening for malignant neoplasm of colon: Secondary | ICD-10-CM

## 2016-09-13 MED ORDER — SODIUM CHLORIDE 0.9 % IV SOLN: 500.0000 mL | INTRAVENOUS | Status: AC

## 2016-09-13 NOTE — Patient Instructions (Signed)
YOU HAD AN ENDOSCOPIC PROCEDURE TODAY AT THE [facility]:   Refer to the procedure report that was given to you for any specific questions about what was found during the examination.  If the procedure report does not answer your questions, please call your gastroenterologist to clarify.  If you requested that your care partner not be given the details of your procedure findings, then the procedure report has been included in a sealed envelope for you to review at your convenience later.  YOU SHOULD EXPECT: Some feelings of bloating in the abdomen. Passage of more gas than usual.  Walking can help get rid of the air that was put into your GI tract during the procedure and reduce the bloating. If you had a lower endoscopy (such as a colonoscopy or flexible sigmoidoscopy) you may notice spotting of blood in your stool or on the toilet paper. If you underwent a bowel prep for your procedure, you may not have a normal bowel movement for a few days.  Please Note:  You might notice some irritation and congestion in your nose or some drainage.  This is from the oxygen used during your procedure.  There is no need for concern and it should clear up in a day or so.  SYMPTOMS TO REPORT IMMEDIATELY:   Following lower endoscopy (colonoscopy or flexible sigmoidoscopy):  Excessive amounts of blood in the stool  Significant tenderness or worsening of abdominal pains  Swelling of the abdomen that is new, acute  Fever of 100F or higher    For urgent or emergent issues, a gastroenterologist can be reached at any hour by calling [phone].   DIET:  We do recommend a small meal at first, but then you may proceed to your regular diet.  Drink plenty of fluids but you should avoid alcoholic beverages for 24 hours.  ACTIVITY:  You should plan to take it easy for the rest of today and you should NOT DRIVE or use heavy machinery until tomorrow (because of the sedation medicines used during the test).     FOLLOW UP: Our staff will call the number listed on your records the next business day following your procedure to check on you and address any questions or concerns that you may have regarding the information given to you following your procedure. If we do not reach you, we will leave a message.  However, if you are feeling well and you are not experiencing any problems, there is no need to return our call.  We will assume that you have returned to your regular daily activities without incident.  If any biopsies were taken you will be contacted by phone or by letter within the next 1-3 weeks.  Please call us at [phone] if you have not heard about the biopsies in 3 weeks.    SIGNATURES/CONFIDENTIALITY: You and/or your care partner have signed paperwork which will be entered into your electronic medical record.  These signatures attest to the fact that that the information above on your After Visit Summary has been reviewed and is understood.  Full responsibility of the confidentiality of this discharge information lies with you and/or your care-partner.   Resume medications. Information given on diverticulosis. 

## 2016-09-13 NOTE — Op Note (Signed)
[facility] Patient Name: Suzanne SchaumannJoyce Moore Procedure Date: 09/13/2016 10:21 AM MRN: [id number] Endoscopist: Sherilyn CooterHenry L. Myrtie Neitheranis , MD Age: [age] Referring MD:  Date of Birth: 1965/06/19 Gender: Female Account #: [id number] Procedure:                Colonoscopy Indications:              Screening for colorectal malignant neoplasm, This                            is the patient's first colonoscopy Medicines:                Monitored Anesthesia Care Procedure:                Pre-Anesthesia Assessment:                           - Prior to the procedure, a History and Physical                            was performed, and patient medications and                            allergies were reviewed. The patient's tolerance of                            previous anesthesia was also reviewed. The risks                            and benefits of the procedure and the sedation                            options and risks were discussed with the patient.                            All questions were answered, and informed consent                            was obtained. Prior Anticoagulants: The patient has                            taken no previous anticoagulant or antiplatelet                            agents. ASA Grade Assessment: II - A patient with                            mild systemic disease. After reviewing the risks                            and benefits, the patient was deemed in                            satisfactory condition to undergo the procedure.  After obtaining informed consent, the colonoscope                            was passed under direct vision. Throughout the                            procedure, the patient's blood pressure, pulse, and                            oxygen saturations were monitored continuously. The                            Model PCF-[id number]) scope was introduced                            through the anus and  advanced to the the cecum,                            identified by appendiceal orifice and ileocecal                            valve. The colonoscopy was performed without                            difficulty. The patient tolerated the procedure                            well. The quality of the bowel preparation was                            excellent. The ileocecal valve, appendiceal                            orifice, and rectum were photographed. The quality                            of the bowel preparation was evaluated using the                            BBPS [facility] Bowel Preparation Scale) with scores                            of: Right Colon = 3, Transverse Colon = 3 and Left                            Colon = 3 (entire mucosa seen well with no residual                            staining, small fragments of stool or opaque                            liquid). The total BBPS score equals 9. The bowel  preparation used was SUPREP. Scope In: 10:37:31 AM Scope Out: 10:51:36 AM Scope Withdrawal Time: 0 hours 11 minutes 17 seconds  Total Procedure Duration: 0 hours 14 minutes 5 seconds  Findings:                 The perianal and digital rectal examinations were                            normal.                           Multiple small-mouthed diverticula were found in                            the left colon and right colon.                           The exam was otherwise without abnormality on                            direct and retroflexion views. Complications:            No immediate complications. Estimated Blood Loss:     Estimated blood loss: none. Impression:               - Diverticulosis in the left colon and in the right                            colon.                           - The examination was otherwise normal on direct                            and retroflexion views.                           - No specimens  collected. Recommendation:           - Patient has a contact number available for                            emergencies. The signs and symptoms of potential                            delayed complications were discussed with the                            patient. Return to normal activities tomorrow.                            Written discharge instructions were provided to the                            patient.                           - Resume previous diet.                           -  Continue present medications.                           - Repeat colonoscopy in 10 years for screening                            purposes. Henry L. Myrtie Neitheranis, MD 09/13/2016 10:54:57 AM This report has been signed electronically.

## 2016-09-13 NOTE — Progress Notes (Signed)
Pt's states no medical or surgical changes since previsit or office visit. 

## 2016-09-13 NOTE — Progress Notes (Signed)
Alert and oriented x3, pleased with MAC, report to RN  

## 2016-09-14 ENCOUNTER — Telehealth: Payer: Self-pay | Admitting: *Deleted

## 2016-09-14 NOTE — Telephone Encounter (Signed)
  Follow up Call-  Call back number 09/13/2016  Post procedure Call Back phone  # [phone]  Permission to leave phone message Yes  Some recent data might be hidden     Patient questions:  Do you have a fever, pain , or abdominal swelling? No. Pain Score  0 *  Have you tolerated food without any problems? Yes.    Have you been able to return to your normal activities? Yes.    Do you have any questions about your discharge instructions: Diet   No. Medications  No. Follow up visit  No.  Do you have questions or concerns about your Care? No.  Actions: * If pain score is 4 or above: No action needed, pain <4.

## 2016-09-14 NOTE — Telephone Encounter (Signed)
  Follow up Call-  Call back number 09/13/2016  Post procedure Call Back phone  # [phone]  Permission to leave phone message Yes  Some recent data might be hidden     No answer at # given.  LM on VM.

## 2016-09-25 ENCOUNTER — Ambulatory Visit
Admission: RE | Admit: 2016-09-25 | Discharge: 2016-09-25 | Disposition: A | Discharge status: Home / Self Care | Payer: BLUE CROSS/BLUE SHIELD | Source: Ambulatory Visit | Attending: Medical | Admitting: Medical

## 2016-09-25 DIAGNOSIS — R922 Inconclusive mammogram: Secondary | ICD-10-CM | POA: Diagnosis not present

## 2016-09-25 DIAGNOSIS — N6489 Other specified disorders of breast: Secondary | ICD-10-CM | POA: Diagnosis not present

## 2016-09-25 DIAGNOSIS — N63 Unspecified lump in unspecified breast: Secondary | ICD-10-CM

## 2017-10-10 ENCOUNTER — Ambulatory Visit: Payer: BLUE CROSS/BLUE SHIELD | Admitting: Medical

## 2017-10-10 ENCOUNTER — Encounter: Payer: Self-pay | Admitting: Medical

## 2017-10-10 VITALS — BP 126/59 | HR 62 | Temp 97.9°F | Resp 16 | Ht 65.0 in | Wt 164.0 lb

## 2017-10-10 DIAGNOSIS — E119 Type 2 diabetes mellitus without complications: Secondary | ICD-10-CM | POA: Diagnosis not present

## 2017-10-10 MED ORDER — METFORMIN HCL ER 500 MG PO TB24: 500.0000 mg | ORAL_TABLET | Freq: Every day | ORAL | 3 refills | Status: DC

## 2017-10-10 NOTE — Patient Instructions (Signed)
For your diabetes, I do think you would benefit with low-dose extended release version of metformin.  Would encourage more exercise and continue with low sugar diet.  When you do get your eye exam please asked optometrist to send us that report and to notify them of your diabetes.  You can get a flu vaccine in October.  You just need to call here and asked for nurse flu vaccine visit.  Would recommend that you call your insurance company and ask if they have a glucometer they can give you or what is their preferred brand.  If we know the preferred brand name that we could send supplies to your pharmacy.  Follow-up in 3months for A1c and fasting lipid panel.

## 2017-10-10 NOTE — Progress Notes (Signed)
Subjective:    Patient ID: Suzanne Moore, female    DOB: 1965-11-20, 52 y.o.   MRN: [id number]  HPI  Pt in for follow up on her diabetes.  Pt a1c was in 170 range. Pt had a1-c yesterday was 6.9. Pt has been exercising a mile a day. Pt has been trying to cut back on sugar. She is trying not to drink sweet tea.   Pt has been checking sugars at home and seeing some reading 180-200.   Pt had gestational diabetes with prior pregnancy and mom/sister has diabetes.     Review of Systems  Constitutional: Negative for chills, fatigue and fever.  Respiratory: Negative for cough, chest tightness, shortness of breath and wheezing.   Cardiovascular: Negative for chest pain and palpitations.  Gastrointestinal: Negative for abdominal distention and anal bleeding.  Endocrine: Negative for polydipsia, polyphagia and polyuria.  Musculoskeletal: Negative for back pain.  Neurological: Negative for dizziness, speech difficulty, weakness, numbness and headaches.  Hematological: Negative for adenopathy. Does not bruise/bleed easily.  Psychiatric/Behavioral: Negative for behavioral problems and confusion.    Past Medical History:  Diagnosis Date  . Allergy   . Diabetes mellitus without complication (HCC)    Gestational Diabetes     Social History   Socioeconomic History  . Marital status: Married    Spouse name: Not on file  . Number of children: Not on file  . Years of education: Not on file  . Highest education level: Not on file  Occupational History  . Not on file  Social Needs  . Financial resource strain: Not on file  . Food insecurity:    Worry: Not on file    Inability: Not on file  . Transportation needs:    Medical: Not on file    Non-medical: Not on file  Tobacco Use  . Smoking status: Former Games developer  . Smokeless tobacco: Never Used  Substance and Sexual Activity  . Alcohol use: No    Alcohol/week: 0.0 standard drinks  . Drug use: No  . Sexual activity: Not on file    Lifestyle  . Physical activity:    Days per week: Not on file    Minutes per session: Not on file  . Stress: Not on file  Relationships  . Social connections:    Talks on phone: Not on file    Gets together: Not on file    Attends religious service: Not on file    Active member of club or organization: Not on file    Attends meetings of clubs or organizations: Not on file    Relationship status: Not on file  . Intimate partner violence:    Fear of current or ex partner: Not on file    Emotionally abused: Not on file    Physically abused: Not on file    Forced sexual activity: Not on file  Other Topics Concern  . Not on file  Social History Narrative  . Not on file    Past Surgical History:  Procedure Laterality Date  . ABLATION    . CESAREAN SECTION     x2  . CHOLECYSTECTOMY    . DENTAL SURGERY     Teeth removed for Dentures    Family History  Problem Relation Age of Onset  . Hyperlipidemia Mother   . Diabetes Mother   . Heart disease Mother   . Bipolar disorder Mother   . Stroke Father   . Glaucoma Father   . Colon cancer Neg  Hx   . Colon polyps Neg Hx   . Esophageal cancer Neg Hx   . Stomach cancer Neg Hx   . Ulcerative colitis Neg Hx     Allergies  Allergen Reactions  . Penicillins     Yeast infections(side effect)    No current outpatient medications on file prior to visit.   No current facility-administered medications on file prior to visit.     BP (!) 126/59   Pulse 62   Temp 97.9 F (36.6 C) (Oral)   Resp 16   Ht 5\' 5"  (1.651 m)   Wt 164 lb (74.4 kg)   SpO2 99%   BMI 27.29 kg/m       Objective:   Physical Exam  General- No acute distress. Pleasant patient. Neck- Full range of motion, no jvd Lungs- Clear, even and unlabored. Heart- regular rate and rhythm. Neurologic- CNII- XII grossly intact.  Lower ext- see quality metrics.     Assessment & Plan:  For your diabetes, I do think you would benefit with low-dose extended  release version of metformin.  Would encourage more exercise and continue with low sugar diet.  When you do get your eye exam please asked optometrist to send us that report and to notify them of your diabetes.  You can get a flu vaccine in October.  You just need to call here and asked for nurse flu vaccine visit.  Would recommend that you call your insurance company and ask if they have a glucometer they can give you or what is their preferred brand.  If we know the preferred brand name that we could send supplies to your pharmacy.  Follow-up in 3months for A1c and fasting lipid panel.  Esperanza RichtersEdward Lesleyanne Politte, PA-C

## 2017-11-14 ENCOUNTER — Other Ambulatory Visit: Payer: Self-pay | Admitting: Medical

## 2017-11-14 DIAGNOSIS — N6310 Unspecified lump in the right breast, unspecified quadrant: Secondary | ICD-10-CM

## 2017-11-21 ENCOUNTER — Ambulatory Visit
Admission: RE | Admit: 2017-11-21 | Discharge: 2017-11-21 | Disposition: A | Discharge status: Home / Self Care | Payer: BLUE CROSS/BLUE SHIELD | Source: Ambulatory Visit | Attending: Medical | Admitting: Medical

## 2017-11-21 DIAGNOSIS — N6310 Unspecified lump in the right breast, unspecified quadrant: Secondary | ICD-10-CM

## 2017-11-21 DIAGNOSIS — N6001 Solitary cyst of right breast: Secondary | ICD-10-CM | POA: Diagnosis not present

## 2017-11-21 DIAGNOSIS — R921 Mammographic calcification found on diagnostic imaging of breast: Secondary | ICD-10-CM | POA: Diagnosis not present

## 2018-01-23 DIAGNOSIS — Z1159 Encounter for screening for other viral diseases: Secondary | ICD-10-CM | POA: Diagnosis not present

## 2018-01-23 DIAGNOSIS — R112 Nausea with vomiting, unspecified: Secondary | ICD-10-CM | POA: Diagnosis not present

## 2018-01-23 DIAGNOSIS — R509 Fever, unspecified: Secondary | ICD-10-CM | POA: Diagnosis not present

## 2018-01-23 DIAGNOSIS — Z20828 Contact with and (suspected) exposure to other viral communicable diseases: Secondary | ICD-10-CM | POA: Diagnosis not present

## 2018-01-23 DIAGNOSIS — E119 Type 2 diabetes mellitus without complications: Secondary | ICD-10-CM | POA: Diagnosis not present

## 2018-01-23 DIAGNOSIS — J1189 Influenza due to unidentified influenza virus with other manifestations: Secondary | ICD-10-CM | POA: Diagnosis not present

## 2018-03-18 ENCOUNTER — Other Ambulatory Visit: Payer: Self-pay | Admitting: Medical

## 2018-03-18 MED ORDER — METFORMIN HCL ER 500 MG PO TB24: 500.0000 mg | ORAL_TABLET | Freq: Every day | ORAL | 0 refills | Status: DC

## 2018-03-18 NOTE — Telephone Encounter (Signed)
Requested medication (s) are due for refill today:  yes  Requested medication (s) are on the active medication list:  yes  Future visit scheduled:  no  Last Refill: 10/10/17; #30 RF x 3  Labs are overdue  Requested Prescriptions  Pending Prescriptions Disp Refills   metFORMIN (GLUCOPHAGE-XR) 500 MG 24 hr tablet 30 tablet 3    Sig: Take 1 tablet (500 mg total) by mouth daily with breakfast.     Endocrinology:  Diabetes - Biguanides Failed - 03/18/2018 10:32 AM      Failed - Cr in normal range and within 360 days    Creatinine, Ser  Date Value Ref Range Status  07/05/2016 0.89 0.40 - 1.20 mg/dL Final         Failed - HBA1C is between 0 and 7.9 and within 180 days    Hgb A1c MFr Bld  Date Value Ref Range Status  07/06/2016 7.5 (H) 4.6 - 6.5 % Final    Comment:    Glycemic Control Guidelines for People with Diabetes:Non Diabetic:  <6%Goal of Therapy: <7%Additional Action Suggested:  >8%          Failed - eGFR in normal range and within 360 days    GFR  Date Value Ref Range Status  07/05/2016 71.07 >60.00 mL/min Final         Passed - Valid encounter within last 6 months    Recent Outpatient Visits          5 months ago Diabetes mellitus without complication ([place])   [facility] [place], [place]   1 year ago Wellness examination   [facility] at [place], [place]   2 years ago Wellness examination   [facility] at [place], [place]   2 years ago Hemorrhoids, unspecified hemorrhoid type   [facility] at [place], [place]   3 years ago Wellness examination   [facility] at [place], [place]

## 2018-03-18 NOTE — Telephone Encounter (Signed)
Copied from CRM [id number]. Topic: Quick Communication - Rx Refill/Question >> Mar 18, 2018  9:29 AM Fanny Bien wrote: Medication: metFORMIN (GLUCOPHAGE-XR) 500 MG 24 hr tablet [[id number]]  pt called and stated that insurance company changed how they do medications and needs this sent over to mail order   Has the patient contacted their pharmacy? Preferred Pharmacy (with phone number or street name):[facility] - [place], [place] - [address] [phone] (Phone) [phone] (Fax)

## 2018-04-20 ENCOUNTER — Other Ambulatory Visit: Payer: Self-pay | Admitting: Medical

## 2018-04-23 NOTE — Telephone Encounter (Signed)
Pt due for follow up please call and schedule appointment.  

## 2018-04-23 NOTE — Telephone Encounter (Signed)
LVM for pt to schedule a fu appt per Provider.

## 2018-05-16 ENCOUNTER — Other Ambulatory Visit: Payer: Self-pay | Admitting: Medical

## 2018-06-14 ENCOUNTER — Other Ambulatory Visit: Payer: Self-pay | Admitting: Medical

## 2018-06-17 NOTE — Telephone Encounter (Signed)
Left pt a message to call back and schedule vov.  

## 2018-06-21 ENCOUNTER — Encounter: Payer: Self-pay | Admitting: Medical

## 2018-06-21 ENCOUNTER — Telehealth: Payer: Self-pay | Admitting: Medical

## 2018-06-21 ENCOUNTER — Other Ambulatory Visit: Payer: Self-pay

## 2018-06-21 ENCOUNTER — Ambulatory Visit (INDEPENDENT_AMBULATORY_CARE_PROVIDER_SITE_OTHER): Payer: BLUE CROSS/BLUE SHIELD | Admitting: Medical

## 2018-06-21 VITALS — Ht 65.0 in | Wt 165.0 lb

## 2018-06-21 DIAGNOSIS — E119 Type 2 diabetes mellitus without complications: Secondary | ICD-10-CM | POA: Diagnosis not present

## 2018-06-21 DIAGNOSIS — Z124 Encounter for screening for malignant neoplasm of cervix: Secondary | ICD-10-CM | POA: Diagnosis not present

## 2018-06-21 DIAGNOSIS — Z0000 Encounter for general adult medical examination without abnormal findings: Secondary | ICD-10-CM | POA: Diagnosis not present

## 2018-06-21 DIAGNOSIS — Z0100 Encounter for examination of eyes and vision without abnormal findings: Secondary | ICD-10-CM

## 2018-06-21 NOTE — Telephone Encounter (Signed)
LVM for pt to call and schedule lab/papsmear in Aug

## 2018-06-21 NOTE — Progress Notes (Signed)
Subjective:    Patient ID: Suzanne Moore, female    DOB: [birth year], 53 y.o.   MRN: [id number]  HPI  Virtual Visit via Video Note  I connected with Suzanne Moore on 06/21/18 at  9:00 AM EDT by a video enabled telemedicine application and verified that I am speaking with the correct person using two identifiers.  Location: Patient: home Provider: work  ProofreaderWellness today virtual. Discussed best to do know as fall pandemic may worsen and be riskier.  Pt has virtual exam today due to covid pandemic. Thought is virtual best format to do visit at this time in order to social distance.   I discussed the limitations of evaluation and management by telemedicine and the availability of in person appointments. The patient expressed understanding and agreed to proceed.  History of Present Illness: I have reviewed pt PMH, PSH, FH, Social History and Surgical History.  Pt has not been exercising daily recently 3-6 miles a day. Diet fairly healthy. former smoker. No alcohol use.   Pt up to date on her mammogram. Done in 11-2017. Recommend repeat in Oct 2020.  Last pap smear normal in 2016. Due now for pap.  LMP- last May. Pt still having hot flashes.   Pt up to date on tdap. Hiv screen completed.  Pt last colonoscopy done in 2018. Diverticulosis found. Repeat date in 10 years.  Pt is former smoker  And diabetic so recommend psv 23.  Diabetic. No recent a1-c, and urine micro albumin not done. No diabetic eye exam.   Observations/Objective:  General- no acute distress. Lungs- no labored breathing. Appears even on video. Neck- normal on inspection. No tracheal deviation. No jvd that I can see. Neuro- gross motor function appears intact. Lower ext- pt reports no issues with feet.  Assessment and Plan: For you wellness exam today I have ordered cbc, cmp, lipid panel, a1c and urine microalbumin  Vaccine advised to get psv 23. Can schedule to be done next week or late summer early fall  with flu vaccine.  Recommend exercise and healthy diet.  Referral to eye MD for diabetic eye exam.  Can get pap scheduled with me late august 3rd week.  Up to date on colonoscopy.  We will let you know lab results as they come in.  Follow up date appointment will be determined after lab review.   Follow Up Instructions:    I discussed the assessment and treatment plan with the patient. The patient was provided an opportunity to ask questions and all were answered. The patient agreed with the plan and demonstrated an understanding of the instructions.   The patient was advised to call back or seek an in-person evaluation if the symptoms worsen or if the condition fails to improve as anticipated.      Esperanza RichtersEdward Moussa Wiegand, PA-C   Review of Systems  Constitutional: Negative for chills and fatigue.  HENT: Negative for congestion, ear discharge, facial swelling, rhinorrhea and sinus pain.   Eyes: Negative for pain.  Respiratory: Negative for choking, shortness of breath and wheezing.   Gastrointestinal: Negative for abdominal pain.  Genitourinary: Negative for decreased urine volume, difficulty urinating, enuresis, flank pain, hematuria and urgency.  Musculoskeletal: Negative for arthralgias, back pain, gait problem and joint swelling.  Skin: Negative for rash.       No skin concerns. Prior derm exam with dermatolgist was negative  Neurological: Negative for dizziness, seizures, weakness and headaches.  Psychiatric/Behavioral: Negative for behavioral problems, dysphoric mood, hallucinations, sleep disturbance and  suicidal ideas.       Objective:   Physical Exam  See objective      Assessment & Plan:

## 2018-06-21 NOTE — Patient Instructions (Addendum)
For you wellness exam today I have ordered cbc, cmp, lipid panel, a1c and urine microalbumin  Vaccine advised to get psv 23. Can schedule to be done next week or late summer early fall with flu vaccine.  Recommend exercise and healthy diet.  Referral to eye MD for diabetic eye exam.  Can get pap scheduled with me late august 3rd week.  Up to date on colonoscopy.  We will let you know lab results as they come in.  Follow up date appointment will be determined after lab review.      Preventive Care 40-64 Years, Female Preventive care refers to lifestyle choices and visits with your health care provider that can promote health and wellness. What does preventive care include?   A yearly physical exam. This is also called an annual well check.  Dental exams once or twice a year.  Routine eye exams. Ask your health care provider how often you should have your eyes checked.  Personal lifestyle choices, including: ? Daily care of your teeth and gums. ? Regular physical activity. ? Eating a healthy diet. ? Avoiding tobacco and drug use. ? Limiting alcohol use. ? Practicing safe sex. ? Taking low-dose aspirin daily starting at age 3. ? Taking vitamin and mineral supplements as recommended by your health care provider. What happens during an annual well check? The services and screenings done by your health care provider during your annual well check will depend on your age, overall health, lifestyle risk factors, and family history of disease. Counseling Your health care provider may ask you questions about your:  Alcohol use.  Tobacco use.  Drug use.  Emotional well-being.  Home and relationship well-being.  Sexual activity.  Eating habits.  Work and work Statistician.  Method of birth control.  Menstrual cycle.  Pregnancy history. Screening You may have the following tests or measurements:  Height, weight, and BMI.  Blood pressure.  Lipid and  cholesterol levels. These may be checked every 5 years, or more frequently if you are over 66 years old.  Skin check.  Lung cancer screening. You may have this screening every year starting at age 31 if you have a 30-pack-year history of smoking and currently smoke or have quit within the past 15 years.  Colorectal cancer screening. All adults should have this screening starting at age 8 and continuing until age 24. Your health care provider may recommend screening at age 28. You will have tests every 1-10 years, depending on your results and the type of screening test. People at increased risk should start screening at an earlier age. Screening tests may include: ? Guaiac-based fecal occult blood testing. ? Fecal immunochemical test (FIT). ? Stool DNA test. ? Virtual colonoscopy. ? Sigmoidoscopy. During this test, a flexible tube with a tiny camera (sigmoidoscope) is used to examine your rectum and lower colon. The sigmoidoscope is inserted through your anus into your rectum and lower colon. ? Colonoscopy. During this test, a long, thin, flexible tube with a tiny camera (colonoscope) is used to examine your entire colon and rectum.  Hepatitis C blood test.  Hepatitis B blood test.  Sexually transmitted disease (STD) testing.  Diabetes screening. This is done by checking your blood sugar (glucose) after you have not eaten for a while (fasting). You may have this done every 1-3 years.  Mammogram. This may be done every 1-2 years. Talk to your health care provider about when you should start having regular mammograms. This may depend on whether you  have a family history of breast cancer.  BRCA-related cancer screening. This may be done if you have a family history of breast, ovarian, tubal, or peritoneal cancers.  Pelvic exam and Pap test. This may be done every 3 years starting at age 29. Starting at age 31, this may be done every 5 years if you have a Pap test in combination with an HPV  test.  Bone density scan. This is done to screen for osteoporosis. You may have this scan if you are at high risk for osteoporosis. Discuss your test results, treatment options, and if necessary, the need for more tests with your health care provider. Vaccines Your health care provider may recommend certain vaccines, such as:  Influenza vaccine. This is recommended every year.  Tetanus, diphtheria, and acellular pertussis (Tdap, Td) vaccine. You may need a Td booster every 10 years.  Varicella vaccine. You may need this if you have not been vaccinated.  Zoster vaccine. You may need this after age 16.  Measles, mumps, and rubella (MMR) vaccine. You may need at least one dose of MMR if you were born in 1957 or later. You may also need a second dose.  Pneumococcal 13-valent conjugate (PCV13) vaccine. You may need this if you have certain conditions and were not previously vaccinated.  Pneumococcal polysaccharide (PPSV23) vaccine. You may need one or two doses if you smoke cigarettes or if you have certain conditions.  Meningococcal vaccine. You may need this if you have certain conditions.  Hepatitis A vaccine. You may need this if you have certain conditions or if you travel or work in places where you may be exposed to hepatitis A.  Hepatitis B vaccine. You may need this if you have certain conditions or if you travel or work in places where you may be exposed to hepatitis B.  Haemophilus influenzae type b (Hib) vaccine. You may need this if you have certain conditions. Talk to your health care provider about which screenings and vaccines you need and how often you need them. This information is not intended to replace advice given to you by your health care provider. Make sure you discuss any questions you have with your health care provider. Document Released: 02/26/2015 Document Revised: 03/22/2017 Document Reviewed: 12/01/2014 Elsevier Interactive Patient Education  2019 [employer].

## 2018-06-26 ENCOUNTER — Telehealth: Payer: Self-pay | Admitting: Medical

## 2018-06-26 ENCOUNTER — Other Ambulatory Visit: Payer: Self-pay

## 2018-06-26 ENCOUNTER — Other Ambulatory Visit (INDEPENDENT_AMBULATORY_CARE_PROVIDER_SITE_OTHER): Payer: BLUE CROSS/BLUE SHIELD

## 2018-06-26 DIAGNOSIS — Z0000 Encounter for general adult medical examination without abnormal findings: Secondary | ICD-10-CM | POA: Diagnosis not present

## 2018-06-26 DIAGNOSIS — E119 Type 2 diabetes mellitus without complications: Secondary | ICD-10-CM

## 2018-06-26 LAB — COMPREHENSIVE METABOLIC PANEL
ALT: 22 U/L (ref 0–35)
AST: 18 U/L (ref 0–37)
Albumin: 4.3 g/dL (ref 3.5–5.2)
Alkaline Phosphatase: 82 U/L (ref 39–117)
BUN: 14 mg/dL (ref 6–23)
CO2: 27 mEq/L (ref 19–32)
Calcium: 8.7 mg/dL (ref 8.4–10.5)
Chloride: 107 mEq/L (ref 96–112)
Creatinine, Ser: 0.86 mg/dL (ref 0.40–1.20)
GFR: 69.03 mL/min (ref >60.00)
Glucose, Bld: 128 mg/dL — ABNORMAL HIGH (ref 70–99)
Potassium: 3.7 mEq/L (ref 3.5–5.1)
Sodium: 141 mEq/L (ref 135–145)
Total Bilirubin: 0.4 mg/dL (ref 0.2–1.2)
Total Protein: 6.6 g/dL (ref 6.0–8.3)

## 2018-06-26 LAB — LIPID PANEL
Cholesterol: 172 mg/dL (ref 0–200)
HDL: 41.5 mg/dL (ref >39.00)
LDL Cholesterol: 108 mg/dL — ABNORMAL HIGH (ref 0–99)
NonHDL: 130.72
Total CHOL/HDL Ratio: 4
Triglycerides: 112 mg/dL (ref 0.0–149.0)
VLDL: 22.4 mg/dL (ref 0.0–40.0)

## 2018-06-26 LAB — CBC WITH DIFFERENTIAL/PLATELET
Basophils Absolute: 0 10*3/uL (ref 0.0–0.1)
Basophils Relative: 0.8 % (ref 0.0–3.0)
Eosinophils Absolute: 0.2 10*3/uL (ref 0.0–0.7)
Eosinophils Relative: 3 % (ref 0.0–5.0)
HCT: 43.4 % (ref 36.0–46.0)
Hemoglobin: 15 g/dL (ref 12.0–15.0)
Lymphocytes Relative: 23.4 % (ref 12.0–46.0)
Lymphs Abs: 1.5 10*3/uL (ref 0.7–4.0)
MCHC: 34.4 g/dL (ref 30.0–36.0)
MCV: 88.3 fl (ref 78.0–100.0)
Monocytes Absolute: 0.5 10*3/uL (ref 0.1–1.0)
Monocytes Relative: 7.2 % (ref 3.0–12.0)
Neutro Abs: 4.2 10*3/uL (ref 1.4–7.7)
Neutrophils Relative %: 65.6 % (ref 43.0–77.0)
Platelets: 157 10*3/uL (ref 150.0–400.0)
RBC: 4.92 Mil/uL (ref 3.87–5.11)
RDW: 12.7 % (ref 11.5–15.5)
WBC: 6.4 10*3/uL (ref 4.0–10.5)

## 2018-06-26 LAB — HEMOGLOBIN A1C: Hgb A1c MFr Bld: 6.4 % (ref 4.6–6.5)

## 2018-06-26 MED ORDER — ATORVASTATIN CALCIUM 10 MG PO TABS: 10.0000 mg | ORAL_TABLET | Freq: Every day | ORAL | 3 refills | Status: DC

## 2018-06-26 NOTE — Telephone Encounter (Signed)
Rx atorvastatin sent to pt pharmacy. 

## 2018-06-27 ENCOUNTER — Other Ambulatory Visit: Payer: Self-pay

## 2018-06-27 MED ORDER — METFORMIN HCL ER 500 MG PO TB24: 500.0000 mg | ORAL_TABLET | Freq: Every day | ORAL | 1 refills | Status: DC

## 2018-11-05 ENCOUNTER — Other Ambulatory Visit: Payer: Self-pay

## 2018-11-05 ENCOUNTER — Telehealth: Payer: Self-pay | Admitting: Medical

## 2018-11-05 ENCOUNTER — Encounter: Payer: Self-pay | Admitting: Family Medicine

## 2018-11-05 ENCOUNTER — Encounter: Payer: Self-pay | Admitting: Medical

## 2018-11-05 ENCOUNTER — Ambulatory Visit (INDEPENDENT_AMBULATORY_CARE_PROVIDER_SITE_OTHER): Payer: BC Managed Care – PPO | Admitting: Medical

## 2018-11-05 VITALS — BP 113/68 | HR 63 | Temp 97.5°F | Resp 16 | Ht 65.0 in | Wt 157.8 lb

## 2018-11-05 DIAGNOSIS — Z23 Encounter for immunization: Secondary | ICD-10-CM

## 2018-11-05 DIAGNOSIS — E119 Type 2 diabetes mellitus without complications: Secondary | ICD-10-CM

## 2018-11-05 DIAGNOSIS — E785 Hyperlipidemia, unspecified: Secondary | ICD-10-CM | POA: Diagnosis not present

## 2018-11-05 DIAGNOSIS — L84 Corns and callosities: Secondary | ICD-10-CM | POA: Diagnosis not present

## 2018-11-05 DIAGNOSIS — Z124 Encounter for screening for malignant neoplasm of cervix: Secondary | ICD-10-CM | POA: Diagnosis not present

## 2018-11-05 LAB — COMPREHENSIVE METABOLIC PANEL
ALT: 28 U/L (ref 0–35)
AST: 24 U/L (ref 0–37)
Albumin: 4.5 g/dL (ref 3.5–5.2)
Alkaline Phosphatase: 70 U/L (ref 39–117)
BUN: 14 mg/dL (ref 6–23)
CO2: 30 mEq/L (ref 19–32)
Calcium: 9.7 mg/dL (ref 8.4–10.5)
Chloride: 105 mEq/L (ref 96–112)
Creatinine, Ser: 0.83 mg/dL (ref 0.40–1.20)
GFR: 71.82 mL/min (ref >60.00)
Glucose, Bld: 113 mg/dL — ABNORMAL HIGH (ref 70–99)
Potassium: 4.3 mEq/L (ref 3.5–5.1)
Sodium: 142 mEq/L (ref 135–145)
Total Bilirubin: 0.7 mg/dL (ref 0.2–1.2)
Total Protein: 6.8 g/dL (ref 6.0–8.3)

## 2018-11-05 LAB — LIPID PANEL
Cholesterol: 155 mg/dL (ref 0–200)
HDL: 42.4 mg/dL (ref >39.00)
LDL Cholesterol: 91 mg/dL (ref 0–99)
NonHDL: 112.68
Total CHOL/HDL Ratio: 4
Triglycerides: 109 mg/dL (ref 0.0–149.0)
VLDL: 21.8 mg/dL (ref 0.0–40.0)

## 2018-11-05 LAB — HEMOGLOBIN A1C: Hgb A1c MFr Bld: 6.2 % (ref 4.6–6.5)

## 2018-11-05 MED ORDER — METFORMIN HCL ER 500 MG PO TB24: 500.0000 mg | ORAL_TABLET | Freq: Every day | ORAL | 1 refills | Status: DC

## 2018-11-05 MED ORDER — ATORVASTATIN CALCIUM 10 MG PO TABS: 10.0000 mg | ORAL_TABLET | Freq: Every day | ORAL | 3 refills | Status: DC

## 2018-11-05 NOTE — Progress Notes (Signed)
Subjective:    Patient ID: Suzanne Moore, female    DOB: Nov 17, 1965, 53 y.o.   MRN: [id number]  HPI  Pt in for follow up.  Pt owns [facility]. She was closed for 5 months.  She also works at [employer].  Pt a1c was well controlled on last visit. I rx'd statin recently and advised on benefit. Pt has not started statin yet. Pt dad had hx of stroke. He was in 50's. Pt is walking about 3-9 miles a day.   Pt is eating low cholesterol and low sugar diet.  Pt ok with getting flu vaccine and pneumonia vaccine today.  Pt has not seen gynecologist  for long time. Last pap?   Review of Systems  Constitutional: Negative for chills, fatigue and fever.  Respiratory: Negative for cough, chest tightness, shortness of breath and wheezing.   Cardiovascular: Negative for chest pain and palpitations.  Gastrointestinal: Negative for abdominal pain.  Musculoskeletal: Negative for back pain and myalgias.  Skin: Negative for rash.  Neurological: Negative for dizziness, speech difficulty, weakness and light-headedness.  Hematological: Negative for adenopathy. Does not bruise/bleed easily.  Psychiatric/Behavioral: Negative for behavioral problems and confusion.    Past Medical History:  Diagnosis Date  . Allergy   . Diabetes mellitus without complication ([place])    Gestational Diabetes     Social History   Socioeconomic History  . Marital status: Married    Spouse name: Not on file  . Number of children: Not on file  . Years of education: Not on file  . Highest education level: Not on file  Occupational History  . Not on file  Social Needs  . Financial resource strain: Not on file  . Food insecurity    Worry: Not on file    Inability: Not on file  . Transportation needs    Medical: Not on file    Non-medical: Not on file  Tobacco Use  . Smoking status: Former Research scientist (life sciences)  . Smokeless tobacco: Never Used  Substance and Sexual Activity  . Alcohol use: No    Alcohol/week: 0.0 standard  drinks  . Drug use: No  . Sexual activity: Not on file  Lifestyle  . Physical activity    Days per week: Not on file    Minutes per session: Not on file  . Stress: Not on file  Relationships  . Social Herbalist on phone: Not on file    Gets together: Not on file    Attends religious service: Not on file    Active member of club or organization: Not on file    Attends meetings of clubs or organizations: Not on file    Relationship status: Not on file  . Intimate partner violence    Fear of current or ex partner: Not on file    Emotionally abused: Not on file    Physically abused: Not on file    Forced sexual activity: Not on file  Other Topics Concern  . Not on file  Social History Narrative  . Not on file    Past Surgical History:  Procedure Laterality Date  . ABLATION    . CESAREAN SECTION     x2  . CHOLECYSTECTOMY    . DENTAL SURGERY     Teeth removed for Dentures    Family History  Problem Relation Age of Onset  . Hyperlipidemia Mother   . Diabetes Mother   . Heart disease Mother   . Bipolar disorder  Mother   . Stroke Father   . Glaucoma Father   . Colon cancer Neg Hx   . Colon polyps Neg Hx   . Esophageal cancer Neg Hx   . Stomach cancer Neg Hx   . Ulcerative colitis Neg Hx     Allergies  Allergen Reactions  . Penicillins     Yeast infections(side effect)    Current Outpatient Medications on File Prior to Visit  Medication Sig Dispense Refill  . atorvastatin (LIPITOR) 10 MG tablet Take 1 tablet (10 mg total) by mouth daily. 30 tablet 3  . metFORMIN (GLUCOPHAGE-XR) 500 MG 24 hr tablet Take 1 tablet (500 mg total) by mouth daily with breakfast. 90 tablet 1   No current facility-administered medications on file prior to visit.     BP 113/68   Pulse 63   Temp (!) 97.5 F (36.4 C) (Temporal)   Resp 16   Ht 5\' 5"  (1.651 m)   Wt 157 lb 12.8 oz (71.6 kg)   SpO2 98%   BMI 26.26 kg/m       Objective:   Physical Exam  General  Mental Status- Alert. General Appearance- Not in acute distress.   Skin General: Color- Normal Color. Moisture- Normal Moisture.  Neck Carotid Arteries- Normal color. Moisture- Normal Moisture. No carotid bruits. No JVD.  Chest and Lung Exam Auscultation: Breath Sounds:-Normal.  Cardiovascular Auscultation:Rythm- Regular. Murmurs & Other Heart Sounds:Auscultation of the heart reveals- No Murmurs.  Abdomen Inspection:-Inspeection Normal. Palpation/Percussion:Note:No mass. Palpation and Percussion of the abdomen reveal- Non Tender, Non Distended + BS, no rebound or guarding.   Neurologic Cranial Nerve exam:- CN III-XII intact(No nystagmus), symmetric smile. Strength:- 5/5 equal and symmetric strength both upper and lower extremities.  Rt 5th toe- medial and lateral callous vs corn or maybe wart? Lower ext- see quality metrics.      Assessment & Plan:  For diabetes will get a1c and cmp. Then decide on dosing on metformin refill.   For mild high cholesterol and hx of diabetes, recommend low dose statin. Rx advisement given.  Flu vaccine and psv 23 vaccine.  Refer to podiatrist for corn vs callous vs wart.   Refer to gyn for papsmear.  Follow up in 3 months or as needed  25+ minutes spent with pt. 50% of time spent counseling on plan going forward.   , PA-C

## 2018-11-05 NOTE — Telephone Encounter (Signed)
Rx atorvastatin and metformin refilled.

## 2018-11-05 NOTE — Addendum Note (Signed)
Addended by: Hinton Dyer on: 11/05/2018 08:49 AM   Modules accepted: Orders

## 2018-11-05 NOTE — Patient Instructions (Signed)
For diabetes will get a1c and cmp. Then decide on dosing on metformin refill.   For mild high cholesterol and hx of diabetes, recommend low dose statin. Rx advisement given.  Flu vaccine and psv 23 vaccine.  Refer to podiatrist for corn vs callous vs wart.   Refer to gyn for papsmear.  Follow up in 3 months or as needed

## 2018-12-24 DIAGNOSIS — E119 Type 2 diabetes mellitus without complications: Secondary | ICD-10-CM | POA: Diagnosis not present

## 2018-12-24 DIAGNOSIS — H5203 Hypermetropia, bilateral: Secondary | ICD-10-CM | POA: Diagnosis not present

## 2018-12-25 ENCOUNTER — Other Ambulatory Visit: Payer: Self-pay

## 2018-12-25 ENCOUNTER — Ambulatory Visit: Payer: BC Managed Care – PPO | Admitting: Podiatry

## 2018-12-25 ENCOUNTER — Encounter: Payer: Self-pay | Admitting: Podiatry

## 2018-12-25 VITALS — BP 109/60

## 2018-12-25 DIAGNOSIS — L84 Corns and callosities: Secondary | ICD-10-CM

## 2018-12-25 DIAGNOSIS — M79671 Pain in right foot: Secondary | ICD-10-CM

## 2018-12-25 DIAGNOSIS — M7751 Other enthesopathy of right foot: Secondary | ICD-10-CM | POA: Diagnosis not present

## 2018-12-25 DIAGNOSIS — M205X1 Other deformities of toe(s) (acquired), right foot: Secondary | ICD-10-CM

## 2018-12-25 NOTE — Progress Notes (Signed)
  Subjective:  Patient ID: Suzanne Moore, female    DOB: 1965-10-13,  MRN: [id number]  Chief Complaint  Patient presents with  . Callouses    pt is here for multiple corns on both of her feet, pt states that the corns has been due to her walking 3-9 miles a day    53 y.o. female presents with the above complaint.  Patient presents with a complaint of right painful fifth digit callus formation in between the toes and the outside of the toe.  She states that this has been going on for couple of years.  She has been ambulating in new balance sneakers but has not helped.  She states that she is got various orthotics to help manage it.  However nothing has helped.  She denies any other alleviating factors.  She denies seeing any other podiatrist.   Review of Systems: Negative except as noted in the HPI. Denies N/V/F/Ch.  Past Medical History:  Diagnosis Date  . Allergy   . Diabetes mellitus without complication ([facility])    Gestational Diabetes    Current Outpatient Medications:  .  atorvastatin (LIPITOR) 10 MG tablet, Take 1 tablet (10 mg total) by mouth daily., Disp: 30 tablet, Rfl: 3 .  metFORMIN (GLUCOPHAGE-XR) 500 MG 24 hr tablet, Take 1 tablet (500 mg total) by mouth daily with breakfast., Disp: 90 tablet, Rfl: 1  Social History   Tobacco Use  Smoking Status Former Smoker  Smokeless Tobacco Never Used    Allergies  Allergen Reactions  . Penicillins     Yeast infections(side effect)   Objective:   Vitals:   12/25/18 0901  BP: 109/60   There is no height or weight on file to calculate BMI. Constitutional Well developed. Well nourished.  Vascular Dorsalis pedis pulses palpable bilaterally. Posterior tibial pulses palpable bilaterally. Capillary refill normal to all digits.  No cyanosis or clubbing noted. Pedal hair growth normal.  Neurologic Normal speech. Oriented to person, place, and time. Epicritic sensation to light touch grossly present bilaterally.  Dermatologic   pain on palpation to the heloma molle in between fourth and fifth digit.  There is an adductovarus component to the fifth digit and a hammertoe contracture of the fourth digit.  The deformities are pressing upon each other causing pain and hyperkeratotic lesion formation. Pain on range of motion of the fifth digit at the DIPJ and PIPJ  Orthopedic: Normal joint ROM without pain or crepitus bilaterally. No visible deformities. No bony tenderness.   Radiographs: None Assessment:   1. Heloma molle   2. Capsulitis of toe of right foot   3. Adductovarus rotation of toe, acquired, right    Plan:  Patient was evaluated and treated and all questions answered.  Right fourth and fifth digit heloma molle/capsulitis -Using a chisel blade and a handle, the lesion was aggressively debrided down to healthy striated tissue.  No complication or pinpoint bleeding noted. -Toe spacer was dispensed to help protect the lesion site.  Right fifth digit capsulitis -A steroid injection was performed at right fifth PIPJ using 1% plain Lidocaine and 10 mg of Kenalog. This was well tolerated.   No follow-ups on file.

## 2018-12-26 LAB — HM DIABETES EYE EXAM

## 2019-01-03 ENCOUNTER — Encounter: Payer: Self-pay | Admitting: Medical

## 2019-02-05 ENCOUNTER — Other Ambulatory Visit: Payer: Self-pay

## 2019-02-05 ENCOUNTER — Ambulatory Visit: Payer: BC Managed Care – PPO | Admitting: Podiatry

## 2019-02-05 DIAGNOSIS — M7751 Other enthesopathy of right foot: Secondary | ICD-10-CM

## 2019-02-05 DIAGNOSIS — L84 Corns and callosities: Secondary | ICD-10-CM

## 2019-02-05 DIAGNOSIS — M205X1 Other deformities of toe(s) (acquired), right foot: Secondary | ICD-10-CM

## 2019-02-05 DIAGNOSIS — M79671 Pain in right foot: Secondary | ICD-10-CM

## 2019-02-08 ENCOUNTER — Encounter: Payer: Self-pay | Admitting: Podiatry

## 2019-02-08 NOTE — Progress Notes (Signed)
Subjective:  Patient ID: Suzanne Moore, female    DOB: Dec 06, 1965,  MRN: [id number]  Chief Complaint  Patient presents with  . Callouses    pt is here for a callous f/u, pt states that the right foot fifth toe, is still tender, pt also states that the pain is elevated at work, pt is looking to get it "shaved" down as well    53 y.o. female presents with the above complaint.  Patient presents with a follow-up of right fourth and fifth digit heloma molle.  Patient states that it still feels tender.  Patient states the pain is about the same.  After the last injection I gave her for capsulitis she states that it resolved for a couple of days but then the pain came right back.  She states that she is constantly on her feet and does not have time to take it easy.  She states the pain is very elevated when doing work and stays about the same afterwards.  She denies any other acute complaints.  She states that she would like to try the injection 1 more time and see if that can give some relief..   Review of Systems: Negative except as noted in the HPI. Denies N/V/F/Ch.  Past Medical History:  Diagnosis Date  . Allergy   . Diabetes mellitus without complication ([facility])    Gestational Diabetes    Current Outpatient Medications:  .  atorvastatin (LIPITOR) 10 MG tablet, Take 1 tablet (10 mg total) by mouth daily., Disp: 30 tablet, Rfl: 3 .  metFORMIN (GLUCOPHAGE-XR) 500 MG 24 hr tablet, Take 1 tablet (500 mg total) by mouth daily with breakfast., Disp: 90 tablet, Rfl: 1  Social History   Tobacco Use  Smoking Status Former Smoker  Smokeless Tobacco Never Used    Allergies  Allergen Reactions  . Penicillins     Yeast infections(side effect)   Objective:   There were no vitals filed for this visit. There is no height or weight on file to calculate BMI. Constitutional Well developed. Well nourished.  Vascular Dorsalis pedis pulses palpable bilaterally. Posterior tibial pulses palpable  bilaterally. Capillary refill normal to all digits.  No cyanosis or clubbing noted. Pedal hair growth normal.  Neurologic Normal speech. Oriented to person, place, and time. Epicritic sensation to light touch grossly present bilaterally.  Dermatologic  pain on palpation to the heloma molle in between fourth and fifth digit.  There is an adductovarus component to the fifth digit and a hammertoe contracture of the fourth digit.  The deformities are pressing upon each other causing pain and hyperkeratotic lesion formation. Pain on range of motion of the fifth digit at the DIPJ and PIPJ  Orthopedic: Normal joint ROM without pain or crepitus bilaterally. No visible deformities. No bony tenderness.   Radiographs: None Assessment:   1. Heloma molle   2. Capsulitis of toe of right foot   3. Adductovarus rotation of toe, acquired, right   4. Pain in right foot   5. Toe contracture, right    Plan:  Patient was evaluated and treated and all questions answered.  Right fourth and fifth digit heloma molle/capsulitis -Using a chisel blade and a handle, the lesion was aggressively debrided down to healthy striated tissue.  No complication or pinpoint bleeding noted. -A steroid injection was performed at right fifth PIPJ using 1% plain Lidocaine and 10 mg of Kenalog. This was well tolerated. -Another toe spacer was dispensed to help protect the lesion site. -I explained  to the patient that the main reason why this is occurring is because of the adductovarus component of the right fifth digit and a hammertoe contracture of the fourth digit.  This is causing excessive pressure in between the toes and leading to these painful lesions.  I explained to her that this does not give any relief with the injection and debridement, we will have to consider surgical intervention during next visit.  Patient agreed with the plan and would like to proceed with injection.  Right fifth digit capsulitis -A steroid  injection was performed at right fifth PIPJ using 1% plain Lidocaine and 10 mg of Kenalog. This was well tolerated.   No follow-ups on file.

## 2019-02-18 ENCOUNTER — Other Ambulatory Visit: Payer: Self-pay

## 2019-02-18 ENCOUNTER — Encounter: Payer: Self-pay | Admitting: Medical

## 2019-02-18 ENCOUNTER — Ambulatory Visit (INDEPENDENT_AMBULATORY_CARE_PROVIDER_SITE_OTHER): Payer: BC Managed Care – PPO | Admitting: Medical

## 2019-02-18 VITALS — Temp 95.5°F | Wt 154.0 lb

## 2019-02-18 DIAGNOSIS — M79673 Pain in unspecified foot: Secondary | ICD-10-CM | POA: Diagnosis not present

## 2019-02-18 DIAGNOSIS — E119 Type 2 diabetes mellitus without complications: Secondary | ICD-10-CM | POA: Diagnosis not present

## 2019-02-18 DIAGNOSIS — E785 Hyperlipidemia, unspecified: Secondary | ICD-10-CM | POA: Diagnosis not present

## 2019-02-18 NOTE — Patient Instructions (Signed)
Your A1c was very tightly controlled 3 months ago.  Continue Metformin, exercise and eat low sugar diet.  Placing future lab orders today and we need to get you scheduled for this in 2 months.  Please call our office to arrange appointment.  For high cholesterol, continue with atorvastatin and low-cholesterol diet.  Future lipid panel placed as well.  I do want you to get over-the-counter blood pressure cuff and check periodically.  This is important to follow since if your blood pressure did increase then that would be additional cardiovascular risk factor.  For your foot pain continue to follow-up with podiatrist.  Follow-up 3 months from date of your future lab work.

## 2019-02-18 NOTE — Progress Notes (Signed)
   Subjective:    Patient ID: Suzanne Moore, female    DOB: 22-Jun-1965, 54 y.o.   MRN: [id number]  HPI  Virtual Visit via Telephone Note  I connected with Suzanne Moore on 02/18/19 at  9:20 AM EST by telephone and verified that I am speaking with the correct person using two identifiers.  Location: Patient: home Provider: office   I discussed the limitations, risks, security and privacy concerns of performing an evaluation and management service by telephone and the availability of in person appointments. I also discussed with the patient that there may be a patient responsible charge related to this service. The patient expressed understanding and agreed to proceed.  Pt has not checked bp today. Asked pt to get bp cuff.    History of Present Illness:   Pt has follow up for 3 month diabetic check.  Pt has seen podiatrist for foot condition heloma molle. Pt getting some steroid injection but may need surgery. Pt has follow up March 04, 2018.  Pt has diabetes and a1c was 6.2 3 months. She is exercising daily but admits did not follow diet over the holidays. Pt has been taking metformin with no side effects.  Pt is on lipitor for high cholesterol and diabetes.     Observations/Objective: General-no acute distress, pleasant, alert, oriented and normal speech.  Assessment and Plan: Your A1c was very tightly controlled 3 months ago.  Continue Metformin, exercise and eat low sugar diet.  Placing future lab orders today and we need to get you scheduled for this in 2 months.  Please call our office to arrange appointment.  For high cholesterol, continue with atorvastatin and low-cholesterol diet.  Future lipid panel placed as well.  I do want you to get over-the-counter blood pressure cuff and check periodically.  This is important to follow since if your blood pressure did increase then that would be additional cardiovascular risk factor.  For your foot pain continue to follow-up with  podiatrist.  Follow-up 3 months from date of your future lab work.  Follow Up Instructions:    I discussed the assessment and treatment plan with the patient. The patient was provided an opportunity to ask questions and all were answered. The patient agreed with the plan and demonstrated an understanding of the instructions.   The patient was advised to call back or seek an in-person evaluation if the symptoms worsen or if the condition fails to improve as anticipated.  I provided 20 minutes of non-face-to-face time during this encounter.   Esperanza Richters, PA-C    Review of Systems     Objective:   Physical Exam        Assessment & Plan:

## 2019-03-05 ENCOUNTER — Ambulatory Visit: Payer: BC Managed Care – PPO | Admitting: Podiatry

## 2019-04-07 ENCOUNTER — Encounter: Payer: Self-pay | Admitting: Medical

## 2019-04-07 ENCOUNTER — Other Ambulatory Visit: Payer: Self-pay

## 2019-04-07 MED ORDER — ATORVASTATIN CALCIUM 10 MG PO TABS: 10.0000 mg | ORAL_TABLET | Freq: Every day | ORAL | 3 refills | Status: DC

## 2019-04-07 MED ORDER — METFORMIN HCL ER 500 MG PO TB24: 500.0000 mg | ORAL_TABLET | Freq: Every day | ORAL | 1 refills | Status: DC

## 2019-05-13 ENCOUNTER — Encounter: Payer: Self-pay | Admitting: Medical

## 2019-07-11 ENCOUNTER — Other Ambulatory Visit: Payer: Self-pay | Admitting: Medical

## 2019-09-21 ENCOUNTER — Other Ambulatory Visit: Payer: Self-pay | Admitting: Medical

## 2019-10-14 ENCOUNTER — Encounter: Payer: Self-pay | Admitting: Medical

## 2019-10-14 MED ORDER — METFORMIN HCL ER 500 MG PO TB24: 500.0000 mg | ORAL_TABLET | Freq: Every day | ORAL | 0 refills | Status: DC

## 2019-10-14 MED ORDER — ATORVASTATIN CALCIUM 10 MG PO TABS: 10.0000 mg | ORAL_TABLET | Freq: Every day | ORAL | 0 refills | Status: AC

## 2019-10-15 ENCOUNTER — Other Ambulatory Visit: Payer: BC Managed Care – PPO

## 2019-10-15 ENCOUNTER — Other Ambulatory Visit: Payer: Self-pay

## 2019-10-15 DIAGNOSIS — Z20822 Contact with and (suspected) exposure to covid-19: Secondary | ICD-10-CM | POA: Diagnosis not present

## 2019-10-16 LAB — NOVEL CORONAVIRUS, NAA: SARS-CoV-2, NAA: NOT DETECTED

## 2019-10-17 ENCOUNTER — Encounter: Payer: Self-pay | Admitting: Medical

## 2019-12-01 DIAGNOSIS — E559 Vitamin D deficiency, unspecified: Secondary | ICD-10-CM | POA: Diagnosis not present

## 2019-12-01 DIAGNOSIS — R5381 Other malaise: Secondary | ICD-10-CM | POA: Diagnosis not present

## 2019-12-01 DIAGNOSIS — E119 Type 2 diabetes mellitus without complications: Secondary | ICD-10-CM | POA: Diagnosis not present

## 2019-12-01 DIAGNOSIS — E785 Hyperlipidemia, unspecified: Secondary | ICD-10-CM | POA: Diagnosis not present

## 2019-12-01 DIAGNOSIS — R5383 Other fatigue: Secondary | ICD-10-CM | POA: Diagnosis not present

## 2019-12-01 DIAGNOSIS — E538 Deficiency of other specified B group vitamins: Secondary | ICD-10-CM | POA: Diagnosis not present

## 2020-01-12 ENCOUNTER — Other Ambulatory Visit: Payer: Self-pay | Admitting: Medical

## 2020-04-15 IMAGING — US ULTRASOUND RIGHT BREAST LIMITED
1 series · 4 of 4 positions shown · non-contrast
Comparison: Previous exam(s).

CLINICAL DATA: Follow-up of a probable benign right breast mass.

EXAM:
DIGITAL DIAGNOSTIC BILATERAL MAMMOGRAM WITH CAD AND TOMO
ULTRASOUND RIGHT BREAST

[Series 1: ultrasound right breast limited · 0.05mm/px · 4 of 4 slices shown]
[im 1/4]
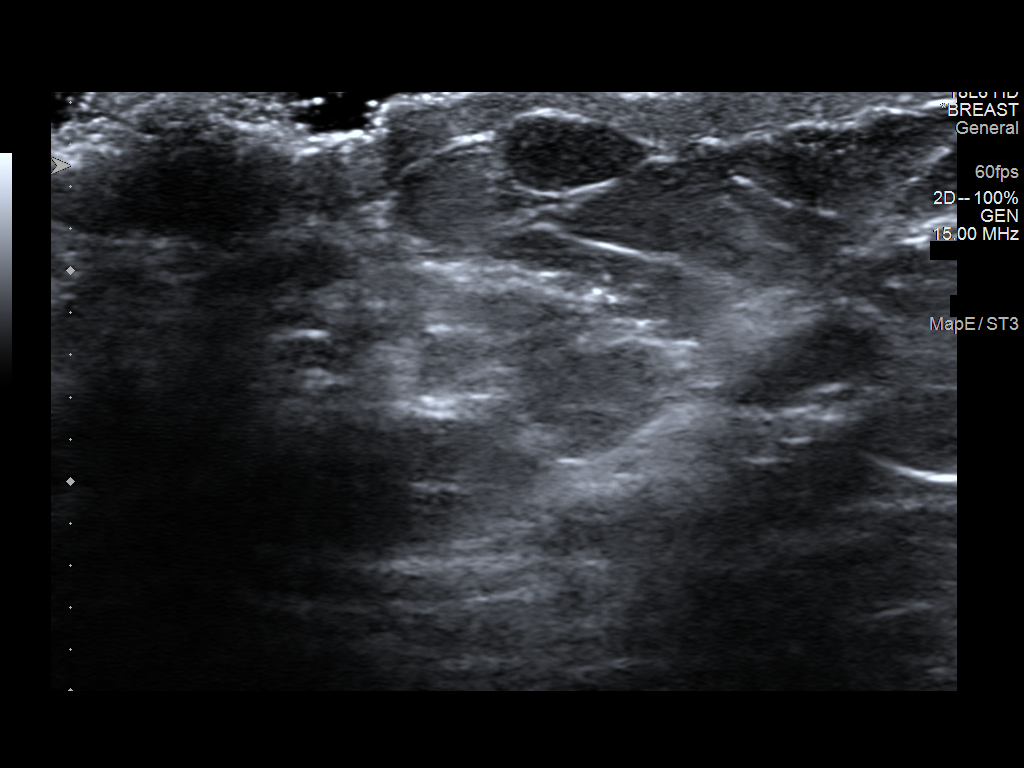
[im 2/4]
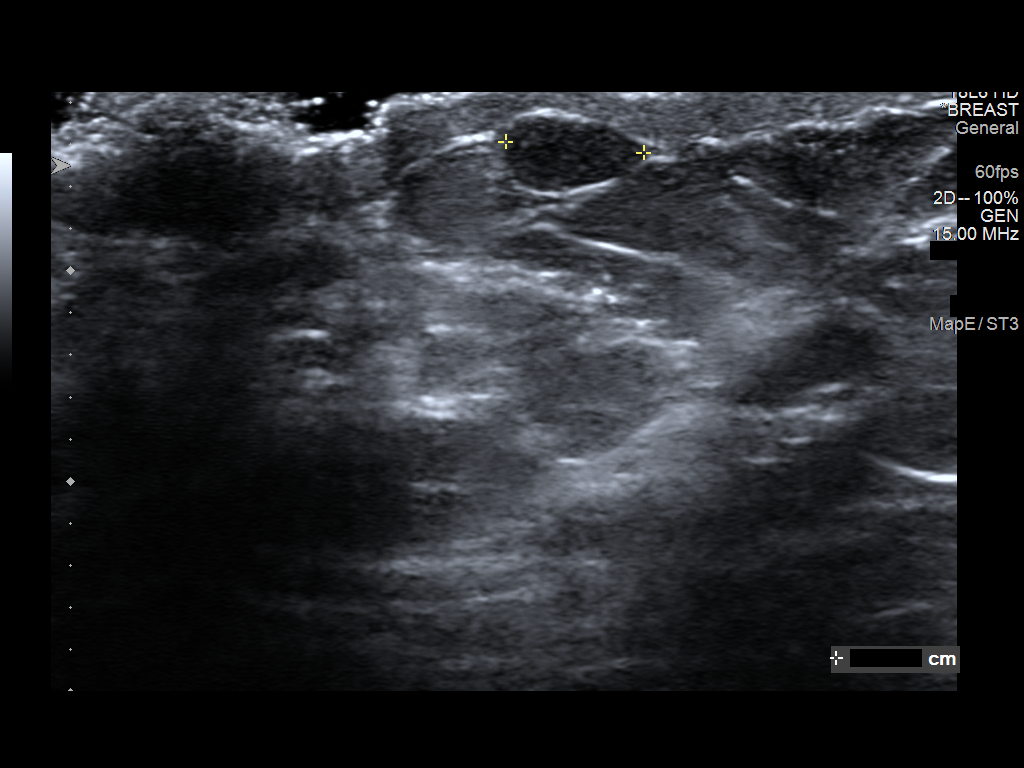
[im 3/4]
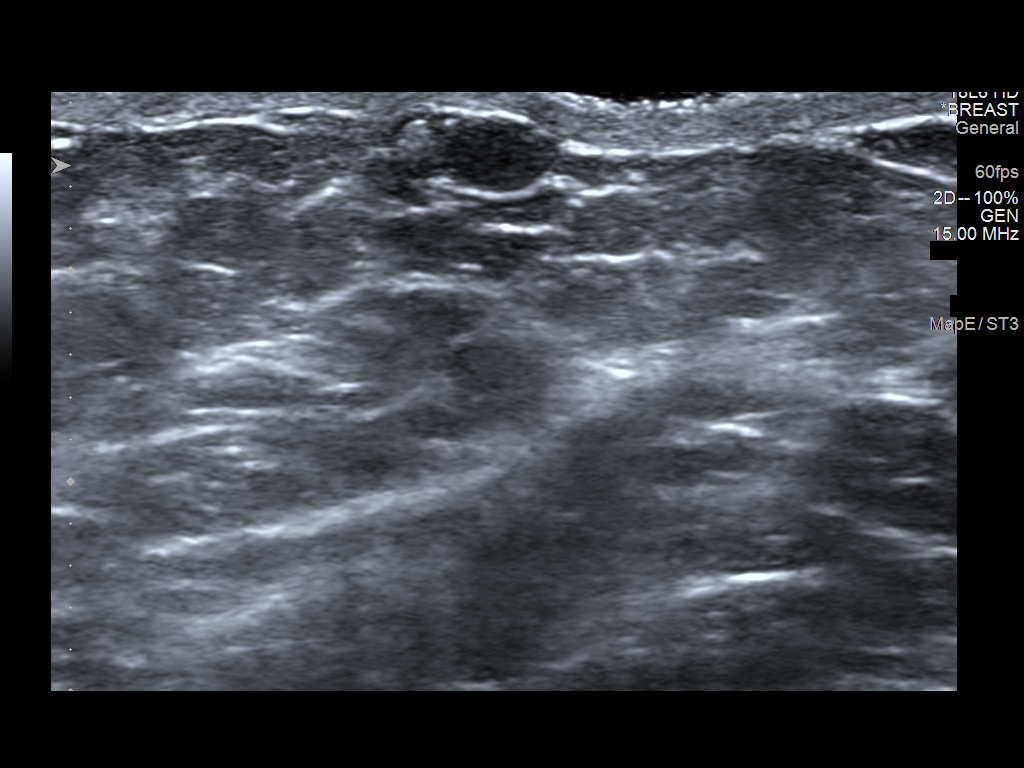
[im 4/4]
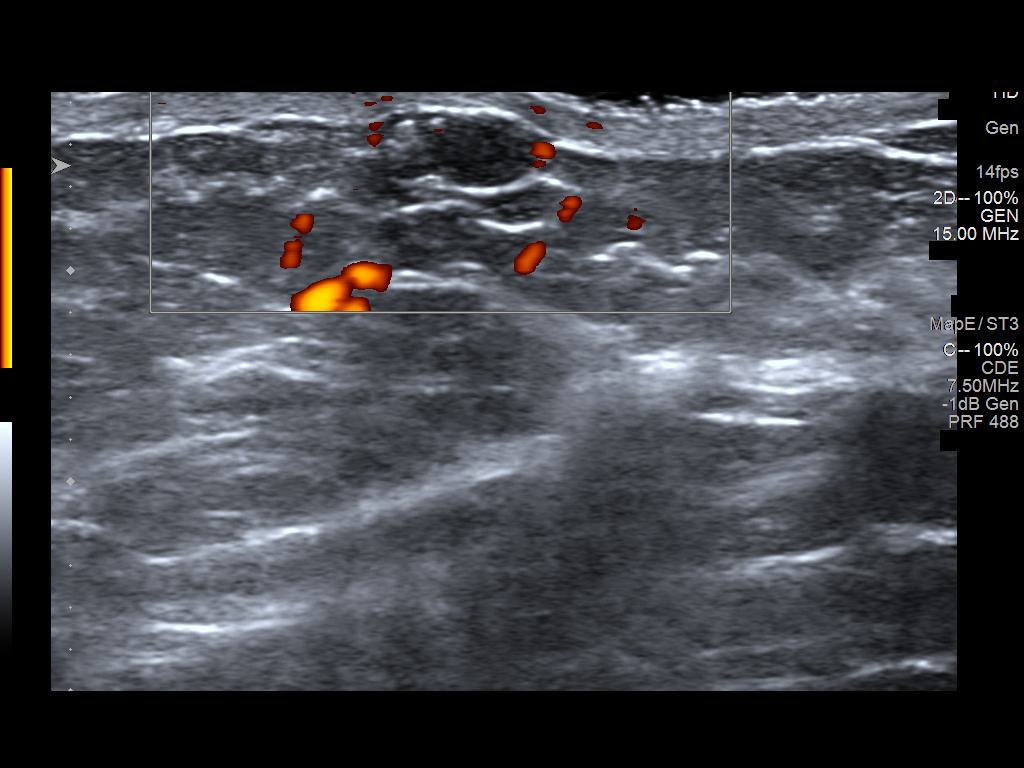

[4 of 4 positions shown; findings below may reference images not displayed]

ACR Breast Density Category b: There are scattered areas of
fibroglandular density.
FINDINGS: The circumscribed retroareolar mass in the right breast with
calcifications has been stable since 09/23/2015. No suspicious mass
or malignant type microcalcifications identified in either breast.

Mammographic images were processed with CAD.

Targeted ultrasound is performed, showing there is an oval 8 x 3 x 7
mm mass with calcifications in the 6 o'clock subareolar region of
the right breast. It is unchanged from the prior exam dated
10/05/2015.
IMPRESSION: Stable benign-appearing mass in the right breast. No evidence of
malignancy in either breast.

RECOMMENDATION:
Bilateral screening mammogram in 1 year is recommended.

I have discussed the findings and recommendations with the patient.
Results were also provided in writing at the conclusion of the
visit. If applicable, a reminder letter will be sent to the patient
regarding the next appointment.

BI-RADS CATEGORY  2: Benign.

## 2020-04-15 IMAGING — MG DIGITAL DIAGNOSTIC BILATERAL MAMMOGRAM WITH TOMO AND CAD
8 series · 8 of 24 positions shown · non-contrast
Comparison: Previous exam(s).

CLINICAL DATA: Follow-up of a probable benign right breast mass.

EXAM:
DIGITAL DIAGNOSTIC BILATERAL MAMMOGRAM WITH CAD AND TOMO
ULTRASOUND RIGHT BREAST

[R MLO synth-2D]
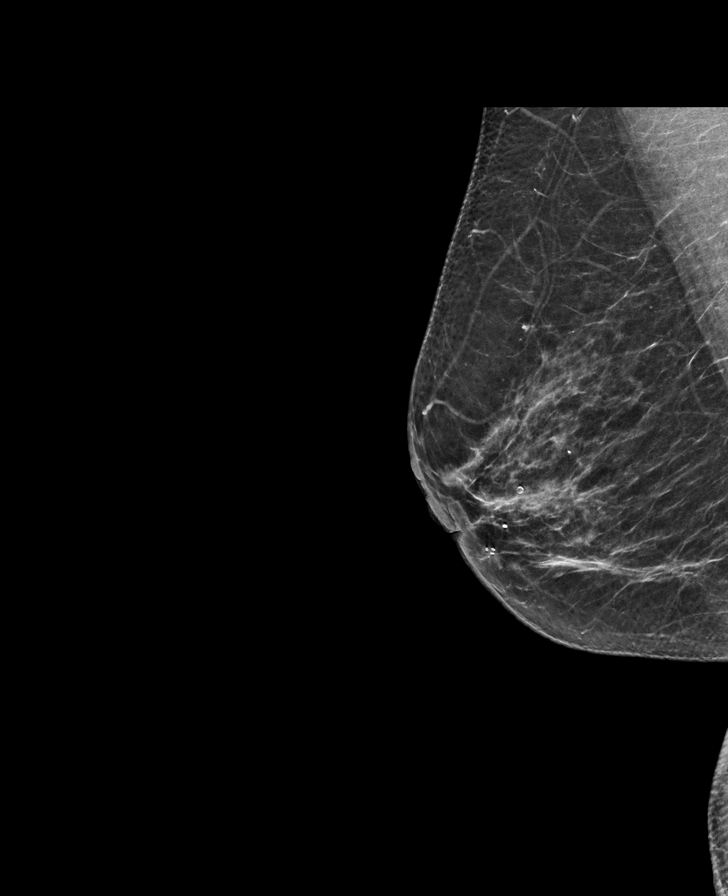

[L CC synth-2D]
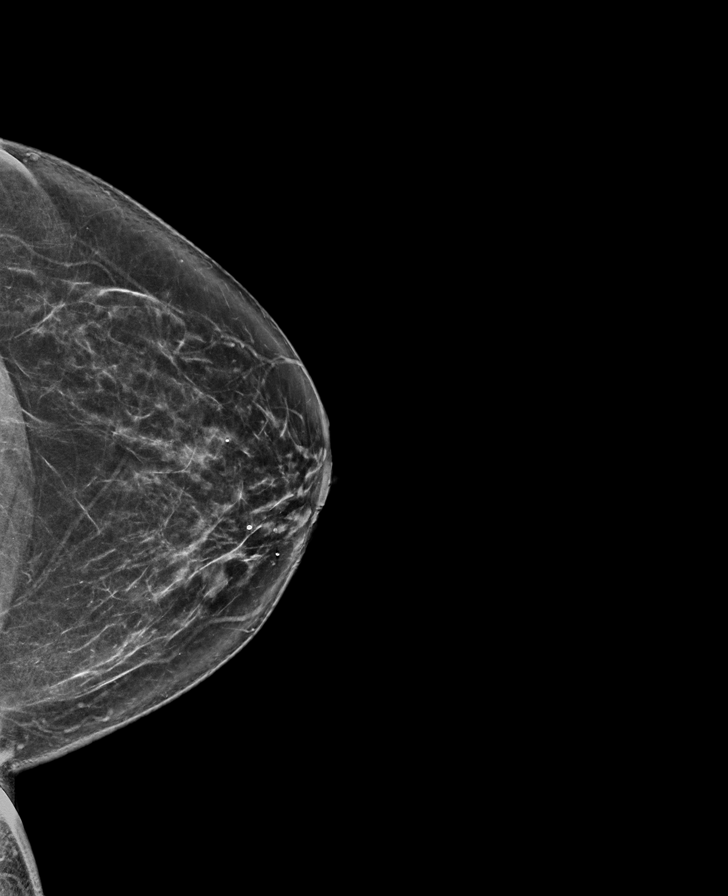

[L MLO synth-2D]
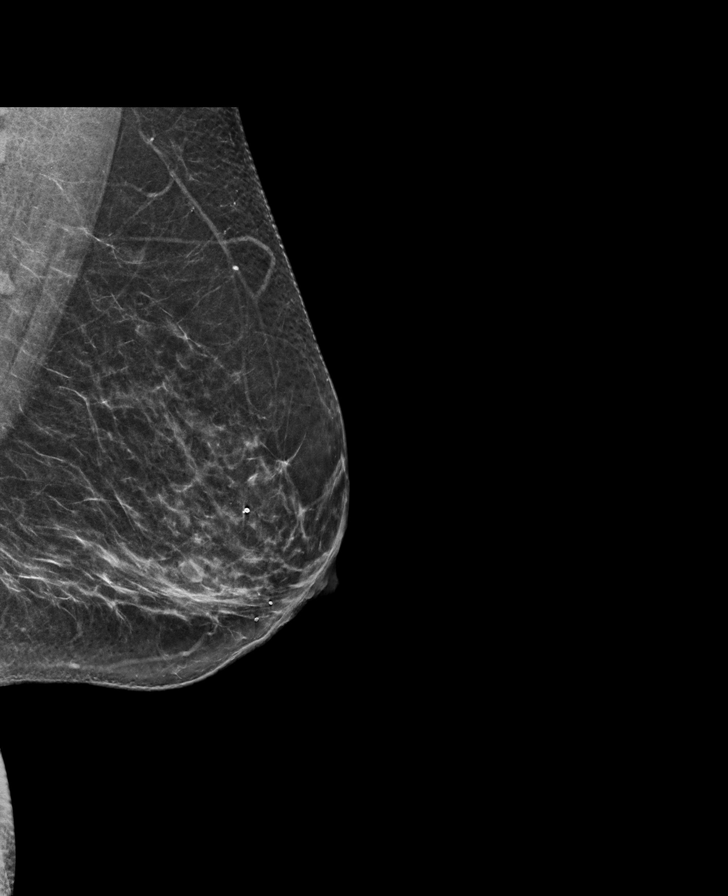

[R CC synth-2D]
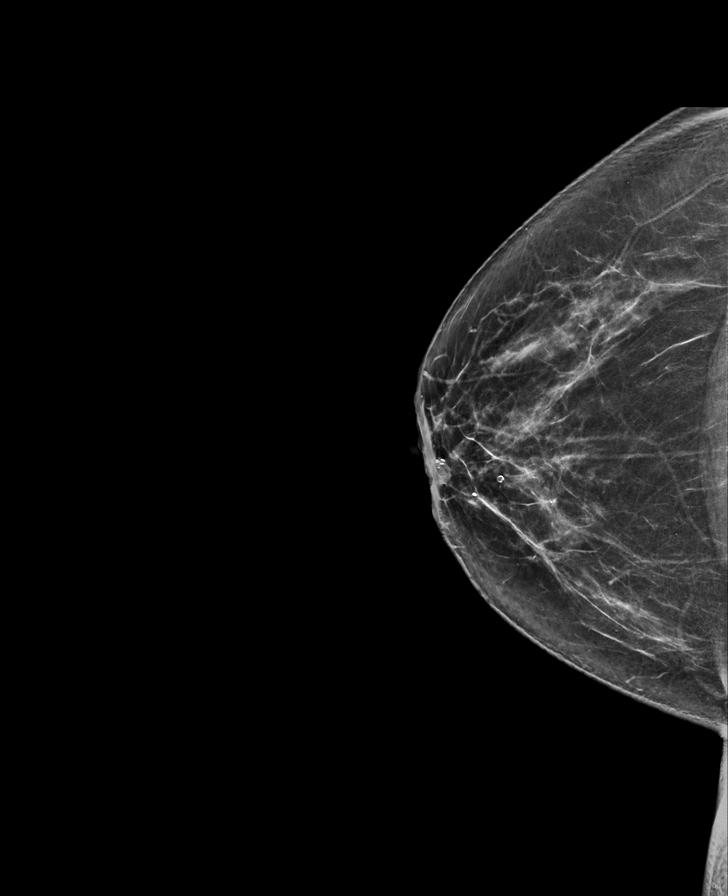

[L CC tomo · tomo slice 35/69.0]
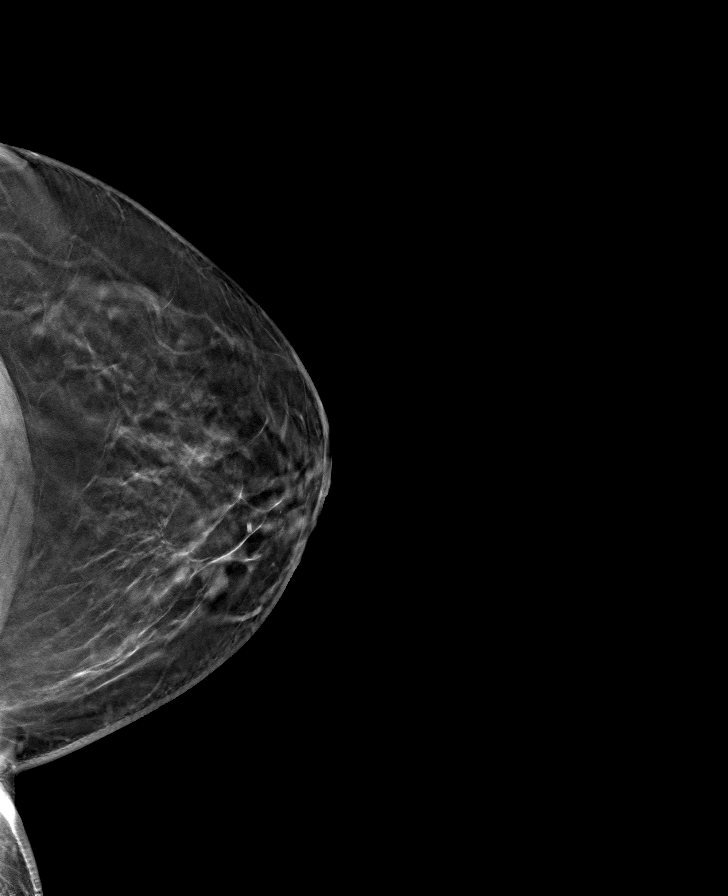

[R CC tomo · tomo slice 35/69.0]
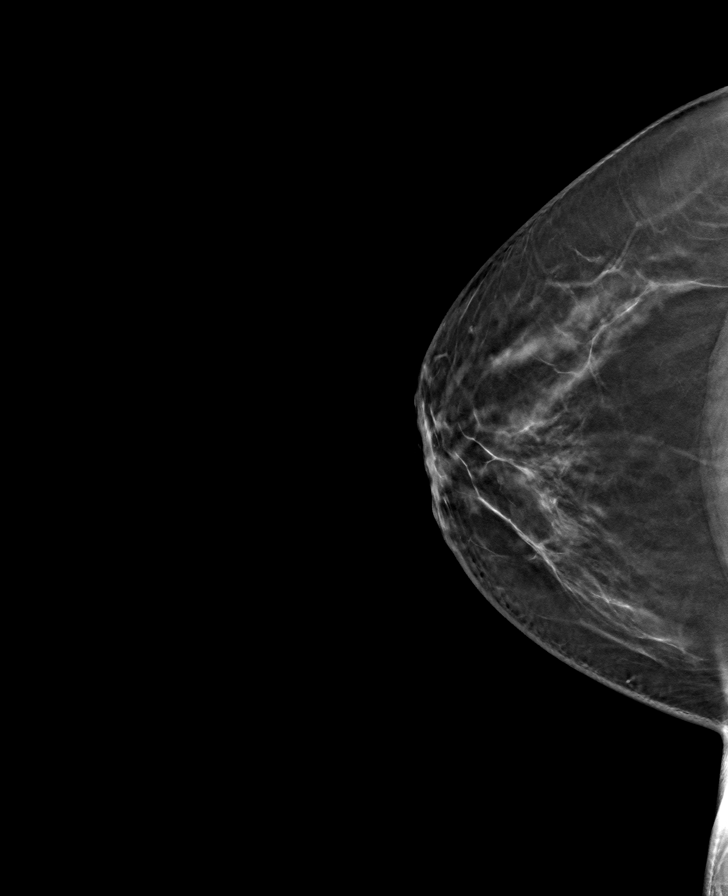

[L MLO tomo · tomo slice 33/66.0]
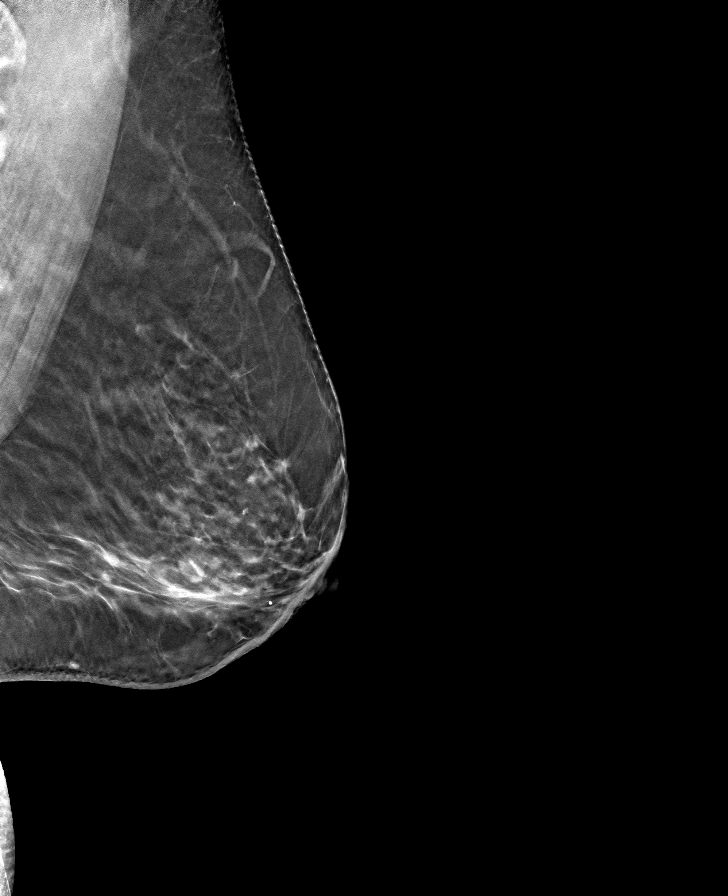

[R MLO tomo · tomo slice 31/62.0]
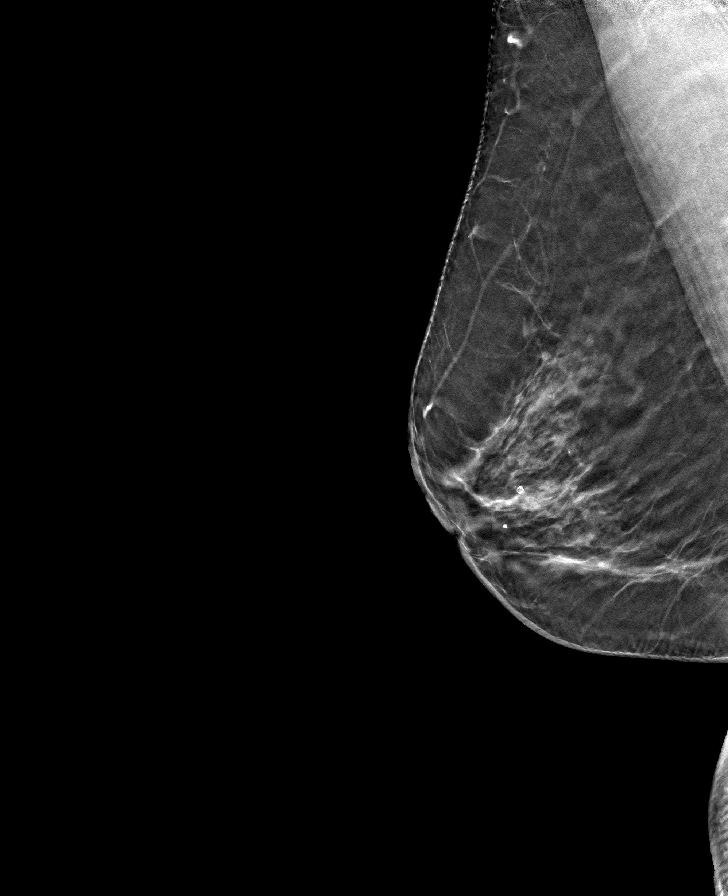

[8 of 24 positions shown; findings below may reference images not displayed]

ACR Breast Density Category b: There are scattered areas of
fibroglandular density.
FINDINGS: The circumscribed retroareolar mass in the right breast with
calcifications has been stable since 09/23/2015. No suspicious mass
or malignant type microcalcifications identified in either breast.

Mammographic images were processed with CAD.

Targeted ultrasound is performed, showing there is an oval 8 x 3 x 7
mm mass with calcifications in the 6 o'clock subareolar region of
the right breast. It is unchanged from the prior exam dated
10/05/2015.
IMPRESSION: Stable benign-appearing mass in the right breast. No evidence of
malignancy in either breast.

RECOMMENDATION:
Bilateral screening mammogram in 1 year is recommended.

I have discussed the findings and recommendations with the patient.
Results were also provided in writing at the conclusion of the
visit. If applicable, a reminder letter will be sent to the patient
regarding the next appointment.

BI-RADS CATEGORY  2: Benign.

## 2020-04-22 DIAGNOSIS — E559 Vitamin D deficiency, unspecified: Secondary | ICD-10-CM | POA: Diagnosis not present

## 2020-04-22 DIAGNOSIS — E785 Hyperlipidemia, unspecified: Secondary | ICD-10-CM | POA: Diagnosis not present

## 2020-04-22 DIAGNOSIS — E119 Type 2 diabetes mellitus without complications: Secondary | ICD-10-CM | POA: Diagnosis not present

## 2020-05-01 DIAGNOSIS — Z03818 Encounter for observation for suspected exposure to other biological agents ruled out: Secondary | ICD-10-CM | POA: Diagnosis not present

## 2020-05-01 DIAGNOSIS — Z20822 Contact with and (suspected) exposure to covid-19: Secondary | ICD-10-CM | POA: Diagnosis not present

## 2020-05-25 DIAGNOSIS — R110 Nausea: Secondary | ICD-10-CM | POA: Diagnosis not present

## 2020-05-25 DIAGNOSIS — A09 Infectious gastroenteritis and colitis, unspecified: Secondary | ICD-10-CM | POA: Diagnosis not present

## 2020-08-31 DIAGNOSIS — E7800 Pure hypercholesterolemia, unspecified: Secondary | ICD-10-CM | POA: Diagnosis not present

## 2020-08-31 DIAGNOSIS — E669 Obesity, unspecified: Secondary | ICD-10-CM | POA: Diagnosis not present

## 2020-09-07 DIAGNOSIS — E669 Obesity, unspecified: Secondary | ICD-10-CM | POA: Diagnosis not present

## 2020-09-07 DIAGNOSIS — E7800 Pure hypercholesterolemia, unspecified: Secondary | ICD-10-CM | POA: Diagnosis not present

## 2020-09-14 DIAGNOSIS — E7800 Pure hypercholesterolemia, unspecified: Secondary | ICD-10-CM | POA: Diagnosis not present

## 2020-09-14 DIAGNOSIS — E669 Obesity, unspecified: Secondary | ICD-10-CM | POA: Diagnosis not present

## 2020-10-21 DIAGNOSIS — E785 Hyperlipidemia, unspecified: Secondary | ICD-10-CM | POA: Diagnosis not present

## 2020-10-21 DIAGNOSIS — E538 Deficiency of other specified B group vitamins: Secondary | ICD-10-CM | POA: Diagnosis not present

## 2020-10-21 DIAGNOSIS — E559 Vitamin D deficiency, unspecified: Secondary | ICD-10-CM | POA: Diagnosis not present

## 2020-10-21 DIAGNOSIS — E119 Type 2 diabetes mellitus without complications: Secondary | ICD-10-CM | POA: Diagnosis not present

## 2020-10-25 DIAGNOSIS — E559 Vitamin D deficiency, unspecified: Secondary | ICD-10-CM | POA: Diagnosis not present

## 2020-10-25 DIAGNOSIS — E785 Hyperlipidemia, unspecified: Secondary | ICD-10-CM | POA: Diagnosis not present

## 2020-10-25 DIAGNOSIS — E119 Type 2 diabetes mellitus without complications: Secondary | ICD-10-CM | POA: Diagnosis not present

## 2020-11-30 DIAGNOSIS — Z713 Dietary counseling and surveillance: Secondary | ICD-10-CM | POA: Diagnosis not present

## 2020-11-30 DIAGNOSIS — E7800 Pure hypercholesterolemia, unspecified: Secondary | ICD-10-CM | POA: Diagnosis not present

## 2020-11-30 DIAGNOSIS — E669 Obesity, unspecified: Secondary | ICD-10-CM | POA: Diagnosis not present

## 2020-12-07 DIAGNOSIS — E7800 Pure hypercholesterolemia, unspecified: Secondary | ICD-10-CM | POA: Diagnosis not present

## 2020-12-07 DIAGNOSIS — Z713 Dietary counseling and surveillance: Secondary | ICD-10-CM | POA: Diagnosis not present

## 2020-12-07 DIAGNOSIS — E669 Obesity, unspecified: Secondary | ICD-10-CM | POA: Diagnosis not present

## 2020-12-14 DIAGNOSIS — E7800 Pure hypercholesterolemia, unspecified: Secondary | ICD-10-CM | POA: Diagnosis not present

## 2020-12-14 DIAGNOSIS — Z713 Dietary counseling and surveillance: Secondary | ICD-10-CM | POA: Diagnosis not present

## 2020-12-14 DIAGNOSIS — E669 Obesity, unspecified: Secondary | ICD-10-CM | POA: Diagnosis not present

## 2020-12-21 DIAGNOSIS — Z713 Dietary counseling and surveillance: Secondary | ICD-10-CM | POA: Diagnosis not present

## 2020-12-21 DIAGNOSIS — E669 Obesity, unspecified: Secondary | ICD-10-CM | POA: Diagnosis not present

## 2020-12-21 DIAGNOSIS — E7800 Pure hypercholesterolemia, unspecified: Secondary | ICD-10-CM | POA: Diagnosis not present

## 2020-12-21 DIAGNOSIS — R7309 Other abnormal glucose: Secondary | ICD-10-CM | POA: Diagnosis not present

## 2020-12-28 DIAGNOSIS — E669 Obesity, unspecified: Secondary | ICD-10-CM | POA: Diagnosis not present

## 2020-12-28 DIAGNOSIS — E7800 Pure hypercholesterolemia, unspecified: Secondary | ICD-10-CM | POA: Diagnosis not present

## 2021-01-11 DIAGNOSIS — E669 Obesity, unspecified: Secondary | ICD-10-CM | POA: Diagnosis not present

## 2021-01-11 DIAGNOSIS — E7800 Pure hypercholesterolemia, unspecified: Secondary | ICD-10-CM | POA: Diagnosis not present

## 2021-01-11 DIAGNOSIS — R635 Abnormal weight gain: Secondary | ICD-10-CM | POA: Diagnosis not present

## 2021-01-11 DIAGNOSIS — Z713 Dietary counseling and surveillance: Secondary | ICD-10-CM | POA: Diagnosis not present

## 2021-01-18 DIAGNOSIS — Z713 Dietary counseling and surveillance: Secondary | ICD-10-CM | POA: Diagnosis not present

## 2021-01-18 DIAGNOSIS — E639 Nutritional deficiency, unspecified: Secondary | ICD-10-CM | POA: Diagnosis not present

## 2021-01-18 DIAGNOSIS — R7301 Impaired fasting glucose: Secondary | ICD-10-CM | POA: Diagnosis not present

## 2021-01-18 DIAGNOSIS — E7800 Pure hypercholesterolemia, unspecified: Secondary | ICD-10-CM | POA: Diagnosis not present

## 2021-01-18 DIAGNOSIS — E669 Obesity, unspecified: Secondary | ICD-10-CM | POA: Diagnosis not present

## 2021-01-25 DIAGNOSIS — E7800 Pure hypercholesterolemia, unspecified: Secondary | ICD-10-CM | POA: Diagnosis not present

## 2021-01-25 DIAGNOSIS — E669 Obesity, unspecified: Secondary | ICD-10-CM | POA: Diagnosis not present

## 2021-02-08 DIAGNOSIS — Z713 Dietary counseling and surveillance: Secondary | ICD-10-CM | POA: Diagnosis not present

## 2021-02-08 DIAGNOSIS — E7800 Pure hypercholesterolemia, unspecified: Secondary | ICD-10-CM | POA: Diagnosis not present

## 2021-02-08 DIAGNOSIS — R7301 Impaired fasting glucose: Secondary | ICD-10-CM | POA: Diagnosis not present

## 2021-02-08 DIAGNOSIS — E639 Nutritional deficiency, unspecified: Secondary | ICD-10-CM | POA: Diagnosis not present

## 2021-04-20 ENCOUNTER — Encounter: Payer: Self-pay | Admitting: *Deleted
